# Patient Record
Sex: Male | Born: 1935 | Race: Black or African American | Hispanic: No | Marital: Married | Smoking: Former smoker
Health system: Southern US, Community
[De-identification: ages and names within clinical notes are randomized; demographics above are authoritative.]

## PROBLEM LIST (undated history)

## (undated) DIAGNOSIS — Z923 Personal history of irradiation: Secondary | ICD-10-CM

## (undated) DIAGNOSIS — G8929 Other chronic pain: Secondary | ICD-10-CM

## (undated) DIAGNOSIS — N400 Enlarged prostate without lower urinary tract symptoms: Secondary | ICD-10-CM

## (undated) DIAGNOSIS — R001 Bradycardia, unspecified: Secondary | ICD-10-CM

## (undated) DIAGNOSIS — M25519 Pain in unspecified shoulder: Secondary | ICD-10-CM

## (undated) DIAGNOSIS — K5909 Other constipation: Secondary | ICD-10-CM

## (undated) DIAGNOSIS — J449 Chronic obstructive pulmonary disease, unspecified: Secondary | ICD-10-CM

## (undated) DIAGNOSIS — R296 Repeated falls: Secondary | ICD-10-CM

## (undated) DIAGNOSIS — M4800 Spinal stenosis, site unspecified: Secondary | ICD-10-CM

## (undated) DIAGNOSIS — I10 Essential (primary) hypertension: Secondary | ICD-10-CM

## (undated) DIAGNOSIS — I4892 Unspecified atrial flutter: Secondary | ICD-10-CM

## (undated) DIAGNOSIS — IMO0002 Reserved for concepts with insufficient information to code with codable children: Secondary | ICD-10-CM

## (undated) DIAGNOSIS — K922 Gastrointestinal hemorrhage, unspecified: Secondary | ICD-10-CM

## (undated) DIAGNOSIS — S42302A Unspecified fracture of shaft of humerus, left arm, initial encounter for closed fracture: Secondary | ICD-10-CM

## (undated) DIAGNOSIS — I998 Other disorder of circulatory system: Secondary | ICD-10-CM

## (undated) DIAGNOSIS — E119 Type 2 diabetes mellitus without complications: Secondary | ICD-10-CM

## (undated) DIAGNOSIS — I5022 Chronic systolic (congestive) heart failure: Secondary | ICD-10-CM

## (undated) DIAGNOSIS — I4891 Unspecified atrial fibrillation: Secondary | ICD-10-CM

## (undated) DIAGNOSIS — M1990 Unspecified osteoarthritis, unspecified site: Secondary | ICD-10-CM

## (undated) DIAGNOSIS — E785 Hyperlipidemia, unspecified: Secondary | ICD-10-CM

## (undated) DIAGNOSIS — W1800XA Striking against unspecified object with subsequent fall, initial encounter: Secondary | ICD-10-CM

## (undated) DIAGNOSIS — I495 Sick sinus syndrome: Secondary | ICD-10-CM

## (undated) DIAGNOSIS — S2241XA Multiple fractures of ribs, right side, initial encounter for closed fracture: Secondary | ICD-10-CM

## (undated) DIAGNOSIS — C3490 Malignant neoplasm of unspecified part of unspecified bronchus or lung: Secondary | ICD-10-CM

## (undated) DIAGNOSIS — I70229 Atherosclerosis of native arteries of extremities with rest pain, unspecified extremity: Secondary | ICD-10-CM

## (undated) DIAGNOSIS — G629 Polyneuropathy, unspecified: Secondary | ICD-10-CM

## (undated) DIAGNOSIS — H9190 Unspecified hearing loss, unspecified ear: Secondary | ICD-10-CM

## (undated) DIAGNOSIS — I639 Cerebral infarction, unspecified: Secondary | ICD-10-CM

## (undated) DIAGNOSIS — F4310 Post-traumatic stress disorder, unspecified: Secondary | ICD-10-CM

## (undated) HISTORY — PX: CARDIOVERSION: SHX1299

## (undated) HISTORY — DX: Unspecified atrial fibrillation: I48.91

## (undated) HISTORY — DX: Other disorder of circulatory system: I99.8

## (undated) HISTORY — DX: Cerebral infarction, unspecified: I63.9

## (undated) HISTORY — DX: Hyperlipidemia, unspecified: E78.5

## (undated) HISTORY — DX: Polyneuropathy, unspecified: G62.9

## (undated) HISTORY — PX: KNEE SURGERY: SHX244

## (undated) HISTORY — DX: Unspecified atrial flutter: I48.92

## (undated) HISTORY — PX: OTHER SURGICAL HISTORY: SHX169

## (undated) HISTORY — DX: Atherosclerosis of native arteries of extremities with rest pain, unspecified extremity: I70.229

## (undated) HISTORY — DX: Pain in unspecified shoulder: M25.519

## (undated) HISTORY — DX: Chronic obstructive pulmonary disease, unspecified: J44.9

## (undated) HISTORY — DX: Gastrointestinal hemorrhage, unspecified: K92.2

## (undated) HISTORY — DX: Unspecified osteoarthritis, unspecified site: M19.90

## (undated) HISTORY — DX: Post-traumatic stress disorder, unspecified: F43.10

## (undated) HISTORY — DX: Other chronic pain: G89.29

## (undated) HISTORY — DX: Unspecified fracture of shaft of humerus, left arm, initial encounter for closed fracture: S42.302A

## (undated) HISTORY — DX: Unspecified hearing loss, unspecified ear: H91.90

## (undated) HISTORY — DX: Benign prostatic hyperplasia without lower urinary tract symptoms: N40.0

## (undated) HISTORY — DX: Spinal stenosis, site unspecified: M48.00

## (undated) HISTORY — DX: Essential (primary) hypertension: I10

## (undated) HISTORY — DX: Sick sinus syndrome: I49.5

## (undated) HISTORY — DX: Other constipation: K59.09

## (undated) HISTORY — DX: Bradycardia, unspecified: R00.1

## (undated) HISTORY — DX: Type 2 diabetes mellitus without complications: E11.9

## (undated) HISTORY — DX: Reserved for concepts with insufficient information to code with codable children: IMO0002

## (undated) HISTORY — DX: Personal history of irradiation: Z92.3

## (undated) HISTORY — DX: Repeated falls: R29.6

---

## 1995-02-28 DIAGNOSIS — I639 Cerebral infarction, unspecified: Secondary | ICD-10-CM

## 1995-02-28 HISTORY — DX: Cerebral infarction, unspecified: I63.9

## 2000-08-10 ENCOUNTER — Encounter: Admission: RE | Admit: 2000-08-10 | Discharge: 2000-09-21 | Payer: Self-pay | Admitting: Internal Medicine

## 2001-09-24 ENCOUNTER — Inpatient Hospital Stay (HOSPITAL_COMMUNITY): Admission: EM | Admit: 2001-09-24 | Discharge: 2001-10-02 | Payer: Self-pay | Admitting: Emergency Medicine

## 2001-09-24 ENCOUNTER — Encounter: Payer: Self-pay | Admitting: Emergency Medicine

## 2001-09-25 ENCOUNTER — Encounter: Payer: Self-pay | Admitting: Internal Medicine

## 2001-09-27 ENCOUNTER — Encounter (INDEPENDENT_AMBULATORY_CARE_PROVIDER_SITE_OTHER): Payer: Self-pay | Admitting: Cardiology

## 2001-10-02 ENCOUNTER — Encounter: Payer: Self-pay | Admitting: Internal Medicine

## 2001-11-12 ENCOUNTER — Ambulatory Visit (HOSPITAL_COMMUNITY): Admission: RE | Admit: 2001-11-12 | Discharge: 2001-11-12 | Payer: Self-pay | Admitting: Internal Medicine

## 2002-05-20 ENCOUNTER — Encounter: Payer: Self-pay | Admitting: Internal Medicine

## 2002-05-20 ENCOUNTER — Encounter: Admission: RE | Admit: 2002-05-20 | Discharge: 2002-05-20 | Payer: Self-pay | Admitting: Internal Medicine

## 2002-06-30 ENCOUNTER — Encounter: Admission: RE | Admit: 2002-06-30 | Discharge: 2002-09-12 | Payer: Self-pay | Admitting: Orthopaedic Surgery

## 2003-06-29 ENCOUNTER — Inpatient Hospital Stay (HOSPITAL_COMMUNITY): Admission: RE | Admit: 2003-06-29 | Discharge: 2003-07-09 | Payer: Self-pay | Admitting: Internal Medicine

## 2003-06-30 ENCOUNTER — Encounter (INDEPENDENT_AMBULATORY_CARE_PROVIDER_SITE_OTHER): Payer: Self-pay | Admitting: Cardiology

## 2003-08-07 ENCOUNTER — Inpatient Hospital Stay (HOSPITAL_COMMUNITY): Admission: EM | Admit: 2003-08-07 | Discharge: 2003-08-14 | Payer: Self-pay | Admitting: Emergency Medicine

## 2003-08-09 ENCOUNTER — Encounter (INDEPENDENT_AMBULATORY_CARE_PROVIDER_SITE_OTHER): Payer: Self-pay | Admitting: Specialist

## 2003-08-15 ENCOUNTER — Emergency Department (HOSPITAL_COMMUNITY): Admission: EM | Admit: 2003-08-15 | Discharge: 2003-08-15 | Payer: Self-pay | Admitting: Emergency Medicine

## 2003-08-21 ENCOUNTER — Inpatient Hospital Stay (HOSPITAL_COMMUNITY): Admission: EM | Admit: 2003-08-21 | Discharge: 2003-08-27 | Payer: Self-pay | Admitting: Emergency Medicine

## 2003-08-21 ENCOUNTER — Emergency Department (HOSPITAL_COMMUNITY): Admission: EM | Admit: 2003-08-21 | Discharge: 2003-08-21 | Payer: Self-pay | Admitting: Emergency Medicine

## 2003-09-02 ENCOUNTER — Emergency Department (HOSPITAL_COMMUNITY): Admission: EM | Admit: 2003-09-02 | Discharge: 2003-09-02 | Payer: Self-pay | Admitting: Emergency Medicine

## 2003-09-16 ENCOUNTER — Ambulatory Visit (HOSPITAL_COMMUNITY): Admission: RE | Admit: 2003-09-16 | Discharge: 2003-09-16 | Payer: Self-pay | Admitting: Internal Medicine

## 2005-05-03 ENCOUNTER — Encounter: Admission: RE | Admit: 2005-05-03 | Discharge: 2005-05-03 | Payer: Self-pay | Admitting: Cardiology

## 2005-10-07 ENCOUNTER — Emergency Department (HOSPITAL_COMMUNITY): Admission: EM | Admit: 2005-10-07 | Discharge: 2005-10-07 | Payer: Self-pay | Admitting: *Deleted

## 2006-10-25 ENCOUNTER — Encounter: Admission: RE | Admit: 2006-10-25 | Discharge: 2006-10-25 | Payer: Self-pay | Admitting: Cardiology

## 2009-06-16 ENCOUNTER — Encounter: Admission: RE | Admit: 2009-06-16 | Discharge: 2009-06-16 | Payer: Self-pay | Admitting: Podiatry

## 2009-10-08 ENCOUNTER — Encounter: Admission: RE | Admit: 2009-10-08 | Discharge: 2009-10-08 | Payer: Self-pay | Admitting: Internal Medicine

## 2009-10-11 ENCOUNTER — Inpatient Hospital Stay (HOSPITAL_COMMUNITY): Admission: EM | Admit: 2009-10-11 | Discharge: 2009-10-21 | Payer: Self-pay | Admitting: Emergency Medicine

## 2009-10-12 ENCOUNTER — Encounter (INDEPENDENT_AMBULATORY_CARE_PROVIDER_SITE_OTHER): Payer: Self-pay | Admitting: Internal Medicine

## 2009-10-19 ENCOUNTER — Ambulatory Visit: Payer: Self-pay | Admitting: Physical Medicine & Rehabilitation

## 2009-10-21 ENCOUNTER — Inpatient Hospital Stay (HOSPITAL_COMMUNITY)
Admission: RE | Admit: 2009-10-21 | Discharge: 2009-11-16 | Payer: Self-pay | Admitting: Physical Medicine & Rehabilitation

## 2009-11-08 ENCOUNTER — Ambulatory Visit: Payer: Self-pay | Admitting: Physical Medicine & Rehabilitation

## 2010-03-20 ENCOUNTER — Encounter: Payer: Self-pay | Admitting: Internal Medicine

## 2010-05-12 LAB — GLUCOSE, CAPILLARY
Glucose-Capillary: 105 mg/dL — ABNORMAL HIGH (ref 70–99)
Glucose-Capillary: 107 mg/dL — ABNORMAL HIGH (ref 70–99)
Glucose-Capillary: 111 mg/dL — ABNORMAL HIGH (ref 70–99)
Glucose-Capillary: 119 mg/dL — ABNORMAL HIGH (ref 70–99)
Glucose-Capillary: 119 mg/dL — ABNORMAL HIGH (ref 70–99)
Glucose-Capillary: 149 mg/dL — ABNORMAL HIGH (ref 70–99)
Glucose-Capillary: 87 mg/dL (ref 70–99)
Glucose-Capillary: 87 mg/dL (ref 70–99)
Glucose-Capillary: 91 mg/dL (ref 70–99)
Glucose-Capillary: 101 mg/dL — ABNORMAL HIGH (ref 70–99)
Glucose-Capillary: 103 mg/dL — ABNORMAL HIGH (ref 70–99)
Glucose-Capillary: 104 mg/dL — ABNORMAL HIGH (ref 70–99)
Glucose-Capillary: 106 mg/dL — ABNORMAL HIGH (ref 70–99)
Glucose-Capillary: 107 mg/dL — ABNORMAL HIGH (ref 70–99)
Glucose-Capillary: 110 mg/dL — ABNORMAL HIGH (ref 70–99)
Glucose-Capillary: 110 mg/dL — ABNORMAL HIGH (ref 70–99)
Glucose-Capillary: 111 mg/dL — ABNORMAL HIGH (ref 70–99)
Glucose-Capillary: 111 mg/dL — ABNORMAL HIGH (ref 70–99)
Glucose-Capillary: 114 mg/dL — ABNORMAL HIGH (ref 70–99)
Glucose-Capillary: 117 mg/dL — ABNORMAL HIGH (ref 70–99)
Glucose-Capillary: 122 mg/dL — ABNORMAL HIGH (ref 70–99)
Glucose-Capillary: 124 mg/dL — ABNORMAL HIGH (ref 70–99)
Glucose-Capillary: 125 mg/dL — ABNORMAL HIGH (ref 70–99)
Glucose-Capillary: 126 mg/dL — ABNORMAL HIGH (ref 70–99)
Glucose-Capillary: 129 mg/dL — ABNORMAL HIGH (ref 70–99)
Glucose-Capillary: 130 mg/dL — ABNORMAL HIGH (ref 70–99)
Glucose-Capillary: 133 mg/dL — ABNORMAL HIGH (ref 70–99)
Glucose-Capillary: 134 mg/dL — ABNORMAL HIGH (ref 70–99)
Glucose-Capillary: 138 mg/dL — ABNORMAL HIGH (ref 70–99)
Glucose-Capillary: 138 mg/dL — ABNORMAL HIGH (ref 70–99)
Glucose-Capillary: 142 mg/dL — ABNORMAL HIGH (ref 70–99)
Glucose-Capillary: 86 mg/dL (ref 70–99)
Glucose-Capillary: 86 mg/dL (ref 70–99)
Glucose-Capillary: 90 mg/dL (ref 70–99)
Glucose-Capillary: 92 mg/dL (ref 70–99)
Glucose-Capillary: 93 mg/dL (ref 70–99)
Glucose-Capillary: 93 mg/dL (ref 70–99)
Glucose-Capillary: 94 mg/dL (ref 70–99)
Glucose-Capillary: 100 mg/dL — ABNORMAL HIGH (ref 70–99)
Glucose-Capillary: 100 mg/dL — ABNORMAL HIGH (ref 70–99)
Glucose-Capillary: 102 mg/dL — ABNORMAL HIGH (ref 70–99)
Glucose-Capillary: 105 mg/dL — ABNORMAL HIGH (ref 70–99)
Glucose-Capillary: 113 mg/dL — ABNORMAL HIGH (ref 70–99)
Glucose-Capillary: 119 mg/dL — ABNORMAL HIGH (ref 70–99)
Glucose-Capillary: 120 mg/dL — ABNORMAL HIGH (ref 70–99)
Glucose-Capillary: 122 mg/dL — ABNORMAL HIGH (ref 70–99)
Glucose-Capillary: 122 mg/dL — ABNORMAL HIGH (ref 70–99)
Glucose-Capillary: 123 mg/dL — ABNORMAL HIGH (ref 70–99)
Glucose-Capillary: 125 mg/dL — ABNORMAL HIGH (ref 70–99)
Glucose-Capillary: 126 mg/dL — ABNORMAL HIGH (ref 70–99)
Glucose-Capillary: 128 mg/dL — ABNORMAL HIGH (ref 70–99)
Glucose-Capillary: 141 mg/dL — ABNORMAL HIGH (ref 70–99)
Glucose-Capillary: 141 mg/dL — ABNORMAL HIGH (ref 70–99)
Glucose-Capillary: 148 mg/dL — ABNORMAL HIGH (ref 70–99)
Glucose-Capillary: 154 mg/dL — ABNORMAL HIGH (ref 70–99)
Glucose-Capillary: 155 mg/dL — ABNORMAL HIGH (ref 70–99)
Glucose-Capillary: 158 mg/dL — ABNORMAL HIGH (ref 70–99)
Glucose-Capillary: 159 mg/dL — ABNORMAL HIGH (ref 70–99)
Glucose-Capillary: 167 mg/dL — ABNORMAL HIGH (ref 70–99)
Glucose-Capillary: 168 mg/dL — ABNORMAL HIGH (ref 70–99)
Glucose-Capillary: 182 mg/dL — ABNORMAL HIGH (ref 70–99)
Glucose-Capillary: 182 mg/dL — ABNORMAL HIGH (ref 70–99)
Glucose-Capillary: 186 mg/dL — ABNORMAL HIGH (ref 70–99)
Glucose-Capillary: 67 mg/dL — ABNORMAL LOW (ref 70–99)
Glucose-Capillary: 82 mg/dL (ref 70–99)
Glucose-Capillary: 88 mg/dL (ref 70–99)
Glucose-Capillary: 92 mg/dL (ref 70–99)
Glucose-Capillary: 93 mg/dL (ref 70–99)
Glucose-Capillary: 95 mg/dL (ref 70–99)
Glucose-Capillary: 96 mg/dL (ref 70–99)
Glucose-Capillary: 96 mg/dL (ref 70–99)
Glucose-Capillary: 99 mg/dL (ref 70–99)

## 2010-05-12 LAB — CBC
HCT: 40.2 % (ref 39.0–52.0)
HCT: 41 % (ref 39.0–52.0)
HCT: 41.3 % (ref 39.0–52.0)
HCT: 42.9 % (ref 39.0–52.0)
HCT: 42.9 % (ref 39.0–52.0)
HCT: 43 % (ref 39.0–52.0)
HCT: 43 % (ref 39.0–52.0)
HCT: 43.8 % (ref 39.0–52.0)
HCT: 44.8 % (ref 39.0–52.0)
Hemoglobin: 13.6 g/dL (ref 13.0–17.0)
Hemoglobin: 13.7 g/dL (ref 13.0–17.0)
Hemoglobin: 14 g/dL (ref 13.0–17.0)
Hemoglobin: 14.4 g/dL (ref 13.0–17.0)
Hemoglobin: 14.5 g/dL (ref 13.0–17.0)
Hemoglobin: 15 g/dL (ref 13.0–17.0)
Hemoglobin: 15.1 g/dL (ref 13.0–17.0)
Hemoglobin: 15.6 g/dL (ref 13.0–17.0)
MCH: 32.3 pg (ref 26.0–34.0)
MCH: 31.8 pg (ref 26.0–34.0)
MCH: 31.9 pg (ref 26.0–34.0)
MCH: 32.2 pg (ref 26.0–34.0)
MCH: 32.3 pg (ref 26.0–34.0)
MCH: 32.8 pg (ref 26.0–34.0)
MCH: 33.1 pg (ref 26.0–34.0)
MCH: 33.2 pg (ref 26.0–34.0)
MCHC: 32.6 g/dL (ref 30.0–36.0)
MCHC: 32.8 g/dL (ref 30.0–36.0)
MCHC: 32.8 g/dL (ref 30.0–36.0)
MCHC: 32.9 g/dL (ref 30.0–36.0)
MCHC: 33.1 g/dL (ref 30.0–36.0)
MCHC: 33.1 g/dL (ref 30.0–36.0)
MCHC: 34.6 g/dL (ref 30.0–36.0)
MCV: 94.4 fL (ref 78.0–100.0)
MCV: 95.3 fL (ref 78.0–100.0)
MCV: 95.3 fL (ref 78.0–100.0)
MCV: 96 fL (ref 78.0–100.0)
MCV: 96 fL (ref 78.0–100.0)
MCV: 96.9 fL (ref 78.0–100.0)
MCV: 97.1 fL (ref 78.0–100.0)
MCV: 97.7 fL (ref 78.0–100.0)
MCV: 98.6 fL (ref 78.0–100.0)
Platelets: 227 10*3/uL (ref 150–400)
Platelets: 101 10*3/uL — ABNORMAL LOW (ref 150–400)
Platelets: 131 10*3/uL — ABNORMAL LOW (ref 150–400)
Platelets: 143 10*3/uL — ABNORMAL LOW (ref 150–400)
Platelets: 68 10*3/uL — ABNORMAL LOW (ref 150–400)
Platelets: 71 10*3/uL — ABNORMAL LOW (ref 150–400)
Platelets: 80 10*3/uL — ABNORMAL LOW (ref 150–400)
Platelets: 84 10*3/uL — ABNORMAL LOW (ref 150–400)
Platelets: 94 10*3/uL — ABNORMAL LOW (ref 150–400)
RBC: 4.26 MIL/uL (ref 4.22–5.81)
RBC: 4.23 MIL/uL (ref 4.22–5.81)
RBC: 4.29 MIL/uL (ref 4.22–5.81)
RBC: 4.39 MIL/uL (ref 4.22–5.81)
RBC: 4.56 MIL/uL (ref 4.22–5.81)
RBC: 4.7 MIL/uL (ref 4.22–5.81)
RDW: 15.3 % (ref 11.5–15.5)
RDW: 15 % (ref 11.5–15.5)
RDW: 15 % (ref 11.5–15.5)
RDW: 15.7 % — ABNORMAL HIGH (ref 11.5–15.5)
RDW: 15.9 % — ABNORMAL HIGH (ref 11.5–15.5)
RDW: 16.4 % — ABNORMAL HIGH (ref 11.5–15.5)
WBC: 5.1 10*3/uL (ref 4.0–10.5)
WBC: 5.5 10*3/uL (ref 4.0–10.5)
WBC: 4.2 10*3/uL (ref 4.0–10.5)
WBC: 4.4 10*3/uL (ref 4.0–10.5)
WBC: 4.5 10*3/uL (ref 4.0–10.5)

## 2010-05-12 LAB — COMPREHENSIVE METABOLIC PANEL
ALT: 16 U/L (ref 0–53)
ALT: 17 U/L (ref 0–53)
ALT: 18 U/L (ref 0–53)
ALT: 20 U/L (ref 0–53)
ALT: 22 U/L (ref 0–53)
ALT: 24 U/L (ref 0–53)
AST: 24 U/L (ref 0–37)
AST: 30 U/L (ref 0–37)
AST: 30 U/L (ref 0–37)
AST: 32 U/L (ref 0–37)
AST: 33 U/L (ref 0–37)
Albumin: 2.5 g/dL — ABNORMAL LOW (ref 3.5–5.2)
Albumin: 2.6 g/dL — ABNORMAL LOW (ref 3.5–5.2)
Albumin: 2.6 g/dL — ABNORMAL LOW (ref 3.5–5.2)
Albumin: 2.9 g/dL — ABNORMAL LOW (ref 3.5–5.2)
Albumin: 3.2 g/dL — ABNORMAL LOW (ref 3.5–5.2)
Alkaline Phosphatase: 56 U/L (ref 39–117)
Alkaline Phosphatase: 56 U/L (ref 39–117)
Alkaline Phosphatase: 58 U/L (ref 39–117)
Alkaline Phosphatase: 64 U/L (ref 39–117)
BUN: 11 mg/dL (ref 6–23)
BUN: 15 mg/dL (ref 6–23)
BUN: 16 mg/dL (ref 6–23)
BUN: 18 mg/dL (ref 6–23)
CO2: 25 mEq/L (ref 19–32)
CO2: 36 mEq/L — ABNORMAL HIGH (ref 19–32)
CO2: 36 mEq/L — ABNORMAL HIGH (ref 19–32)
CO2: 38 mEq/L — ABNORMAL HIGH (ref 19–32)
CO2: 38 mEq/L — ABNORMAL HIGH (ref 19–32)
CO2: 40 mEq/L — ABNORMAL HIGH (ref 19–32)
Calcium: 8.6 mg/dL (ref 8.4–10.5)
Calcium: 8.6 mg/dL (ref 8.4–10.5)
Calcium: 8.8 mg/dL (ref 8.4–10.5)
Calcium: 8.8 mg/dL (ref 8.4–10.5)
Calcium: 8.8 mg/dL (ref 8.4–10.5)
Calcium: 8.8 mg/dL (ref 8.4–10.5)
Calcium: 8.9 mg/dL (ref 8.4–10.5)
Chloride: 107 mEq/L (ref 96–112)
Chloride: 96 mEq/L (ref 96–112)
Chloride: 97 mEq/L (ref 96–112)
Chloride: 99 mEq/L (ref 96–112)
Creatinine, Ser: 0.81 mg/dL (ref 0.4–1.5)
Creatinine, Ser: 0.91 mg/dL (ref 0.4–1.5)
Creatinine, Ser: 0.95 mg/dL (ref 0.4–1.5)
Creatinine, Ser: 1.04 mg/dL (ref 0.4–1.5)
Creatinine, Ser: 1.07 mg/dL (ref 0.4–1.5)
Creatinine, Ser: 1.07 mg/dL (ref 0.4–1.5)
GFR calc Af Amer: >60 mL/min (ref >60)
GFR calc Af Amer: >60 mL/min (ref >60)
GFR calc Af Amer: >60 mL/min (ref >60)
GFR calc Af Amer: >60 mL/min (ref >60)
GFR calc Af Amer: >60 mL/min (ref >60)
GFR calc non Af Amer: >60 mL/min (ref >60)
GFR calc non Af Amer: >60 mL/min (ref >60)
GFR calc non Af Amer: >60 mL/min (ref >60)
GFR calc non Af Amer: >60 mL/min (ref >60)
GFR calc non Af Amer: >60 mL/min (ref >60)
Glucose, Bld: 108 mg/dL — ABNORMAL HIGH (ref 70–99)
Glucose, Bld: 86 mg/dL (ref 70–99)
Glucose, Bld: 89 mg/dL (ref 70–99)
Glucose, Bld: 96 mg/dL (ref 70–99)
Glucose, Bld: 97 mg/dL (ref 70–99)
Potassium: 3.5 mEq/L (ref 3.5–5.1)
Potassium: 3.6 mEq/L (ref 3.5–5.1)
Potassium: 3.7 mEq/L (ref 3.5–5.1)
Sodium: 140 mEq/L (ref 135–145)
Sodium: 141 mEq/L (ref 135–145)
Sodium: 143 mEq/L (ref 135–145)
Sodium: 143 mEq/L (ref 135–145)
Sodium: 144 mEq/L (ref 135–145)
Sodium: 145 mEq/L (ref 135–145)
Sodium: 145 mEq/L (ref 135–145)
Total Bilirubin: 0.5 mg/dL (ref 0.3–1.2)
Total Bilirubin: 0.7 mg/dL (ref 0.3–1.2)
Total Bilirubin: 0.8 mg/dL (ref 0.3–1.2)
Total Bilirubin: 0.9 mg/dL (ref 0.3–1.2)
Total Protein: 5.2 g/dL — ABNORMAL LOW (ref 6.0–8.3)
Total Protein: 5.7 g/dL — ABNORMAL LOW (ref 6.0–8.3)
Total Protein: 5.7 g/dL — ABNORMAL LOW (ref 6.0–8.3)
Total Protein: 5.8 g/dL — ABNORMAL LOW (ref 6.0–8.3)

## 2010-05-12 LAB — UIFE/LIGHT CHAINS/TP QN, 24-HR UR
Beta, Urine: DETECTED — AB
Free Lambda Excretion/Day: 6.48 mg/d
Free Lambda Lt Chains,Ur: 0.1 mg/dL (ref 0.08–1.01)
Free Lt Chn Excr Rate: 49.86 mg/d
Time: 24 hours
Total Protein, Urine-Ur/day: 117 mg/d (ref 10–140)
Total Protein, Urine: 1.8 mg/dL
Volume, Urine: 6475 mL

## 2010-05-12 LAB — DIGOXIN LEVEL
Digoxin Level: 0.5 ng/mL — ABNORMAL LOW (ref 0.8–2.0)
Digoxin Level: 0.6 ng/mL — ABNORMAL LOW (ref 0.8–2.0)
Digoxin Level: 1 ng/mL (ref 0.8–2.0)

## 2010-05-12 LAB — BASIC METABOLIC PANEL
BUN: 11 mg/dL (ref 6–23)
BUN: 11 mg/dL (ref 6–23)
BUN: 12 mg/dL (ref 6–23)
CO2: 30 mEq/L (ref 19–32)
CO2: 33 mEq/L — ABNORMAL HIGH (ref 19–32)
CO2: 34 mEq/L — ABNORMAL HIGH (ref 19–32)
CO2: 35 mEq/L — ABNORMAL HIGH (ref 19–32)
Calcium: 8.8 mg/dL (ref 8.4–10.5)
Calcium: 8.9 mg/dL (ref 8.4–10.5)
Calcium: 8.9 mg/dL (ref 8.4–10.5)
Chloride: 105 mEq/L (ref 96–112)
Chloride: 101 mEq/L (ref 96–112)
Chloride: 103 mEq/L (ref 96–112)
Chloride: 101 mEq/L (ref 96–112)
Chloride: 99 mEq/L (ref 96–112)
Creatinine, Ser: 0.74 mg/dL (ref 0.4–1.5)
Creatinine, Ser: 0.81 mg/dL (ref 0.4–1.5)
Creatinine, Ser: 1.05 mg/dL (ref 0.4–1.5)
GFR calc Af Amer: >60 mL/min (ref >60)
GFR calc Af Amer: >60 mL/min (ref >60)
GFR calc Af Amer: >60 mL/min (ref >60)
GFR calc Af Amer: >60 mL/min (ref >60)
GFR calc non Af Amer: 51 mL/min — ABNORMAL LOW (ref >60)
Glucose, Bld: 106 mg/dL — ABNORMAL HIGH (ref 70–99)
Potassium: 4.2 mEq/L (ref 3.5–5.1)
Potassium: 4.3 mEq/L (ref 3.5–5.1)
Sodium: 141 mEq/L (ref 135–145)
Sodium: 141 mEq/L (ref 135–145)
Sodium: 140 mEq/L (ref 135–145)
Sodium: 144 mEq/L (ref 135–145)

## 2010-05-12 LAB — CARDIAC PANEL(CRET KIN+CKTOT+MB+TROPI)
CK, MB: 2.5 ng/mL (ref 0.3–4.0)
Relative Index: 1.8 (ref 0.0–2.5)
Relative Index: 2 (ref 0.0–2.5)
Total CK: 110 U/L (ref 7–232)
Total CK: 139 U/L (ref 7–232)
Troponin I: 0.05 ng/mL (ref 0.00–0.06)

## 2010-05-12 LAB — DIFFERENTIAL
Basophils Absolute: 0 10*3/uL (ref 0.0–0.1)
Basophils Absolute: 0 10*3/uL (ref 0.0–0.1)
Basophils Relative: 1 % (ref 0–1)
Basophils Relative: 1 % (ref 0–1)
Eosinophils Absolute: 0 10*3/uL (ref 0.0–0.7)
Eosinophils Absolute: 0 10*3/uL (ref 0.0–0.7)
Eosinophils Absolute: 0.1 10*3/uL (ref 0.0–0.7)
Eosinophils Relative: 1 % (ref 0–5)
Eosinophils Relative: 1 % (ref 0–5)
Lymphocytes Relative: 22 % (ref 12–46)
Lymphocytes Relative: 29 % (ref 12–46)
Lymphocytes Relative: 55 % — ABNORMAL HIGH (ref 12–46)
Lymphs Abs: 1.1 10*3/uL (ref 0.7–4.0)
Lymphs Abs: 2.4 10*3/uL (ref 0.7–4.0)
Monocytes Relative: 10 % (ref 3–12)
Monocytes Relative: 5 % (ref 3–12)
Monocytes Relative: 8 % (ref 3–12)
Neutro Abs: 2.9 10*3/uL (ref 1.7–7.7)
Neutro Abs: 3.1 10*3/uL (ref 1.7–7.7)
Neutrophils Relative %: 65 % (ref 43–77)
Neutrophils Relative %: 67 % (ref 43–77)
Neutrophils Relative %: 72 % (ref 43–77)

## 2010-05-12 LAB — PROTEIN ELECTROPHORESIS, SERUM
Albumin ELP: 57.3 % (ref 55.8–66.1)
Alpha-1-Globulin: 4.8 % (ref 2.9–4.9)
Alpha-2-Globulin: 9.4 % (ref 7.1–11.8)
Beta 2: 4.4 % (ref 3.2–6.5)
Beta Globulin: 5.4 % (ref 4.7–7.2)

## 2010-05-12 LAB — URINALYSIS, ROUTINE W REFLEX MICROSCOPIC
Bilirubin Urine: NEGATIVE
Glucose, UA: NEGATIVE mg/dL
Hgb urine dipstick: NEGATIVE
Specific Gravity, Urine: 1.015 (ref 1.005–1.030)
Urobilinogen, UA: 4 mg/dL — ABNORMAL HIGH (ref 0.0–1.0)
pH: 8 (ref 5.0–8.0)

## 2010-05-12 LAB — URINE CULTURE: Culture  Setup Time: 201108220950

## 2010-05-12 LAB — CULTURE, BLOOD (ROUTINE X 2): Culture: NO GROWTH

## 2010-05-12 LAB — BRAIN NATRIURETIC PEPTIDE
Pro B Natriuretic peptide (BNP): 165 pg/mL — ABNORMAL HIGH (ref 0.0–100.0)
Pro B Natriuretic peptide (BNP): 244 pg/mL — ABNORMAL HIGH (ref 0.0–100.0)
Pro B Natriuretic peptide (BNP): 590 pg/mL — ABNORMAL HIGH (ref 0.0–100.0)
Pro B Natriuretic peptide (BNP): 595 pg/mL — ABNORMAL HIGH (ref 0.0–100.0)
Pro B Natriuretic peptide (BNP): 611 pg/mL — ABNORMAL HIGH (ref 0.0–100.0)

## 2010-05-12 LAB — CREATININE CLEARANCE, URINE, 24 HOUR
Creatinine, 24H Ur: 1178 mg/d (ref 800–2000)
Creatinine: 1.35 mg/dL (ref 0.4–1.5)
Urine Total Volume-CRCL: 6475 mL

## 2010-05-12 LAB — MRSA PCR SCREENING: MRSA by PCR: NEGATIVE

## 2010-05-12 LAB — HEMOGLOBIN A1C
Hgb A1c MFr Bld: 5.9 % — ABNORMAL HIGH (ref <5.7)
Mean Plasma Glucose: 123 mg/dL — ABNORMAL HIGH (ref <117)

## 2010-07-15 NOTE — Op Note (Signed)
NAME:  [name], [name]                         ACCOUNT NO.:  [id number]   MEDICAL RECORD NO.:  [id number]                   PATIENT TYPE:  INP   LOCATION:  3306                                 FACILITY:  [facility]   PHYSICIAN:  Georgiana Spinner, M.D.                 DATE OF BIRTH:  [birth year]   DATE OF PROCEDURE:  08/09/2003  DATE OF DISCHARGE:                                 OPERATIVE REPORT   PROCEDURE:  Upper endoscopy.   INDICATIONS:  Recent gastrointestinal bleeding, presumed upper because of  black, tarry stools.   ANESTHESIA:  1. Demerol 20 mg.  2. Versed 3 mg.   DESCRIPTION OF PROCEDURE:  With patient mildly sedated in the left lateral  decubitus position, the Olympus videoscopic endoscope was inserted in the  mouth, passed under direct vision through the esophagus, which appeared  normal.  In the distal esophagus, there was a question of a varix, but there  was no red spot or indications that it may have bled.  There was only a  single varix without a column.  We entered into the stomach, and some  scattered erythema consistent with a gastritis etiology was seen in the  fundus.  This was photographed.  It was rather scattered and superficial-  appearing with no extension into the body or antrum, and this was  photographed and biopsied.  The body and antrum appeared normal.  There was  bile seen in the stomach.  We entered into the duodenal bulb, second portion  of duodenum.  Again, bile was seen.  The endoscope was then withdrawn taking  circumferential views of the duodenal mucosa until the endoscope then pulled  back into the stomach, placed in retroflexion to view the stomach from  below.  The endoscope was then straightened and withdrawn, taking  circumferential views of the remaining gastric and esophageal mucosa.  The  patient's vital signs and pulse oximeter remained stable.  The patient  tolerated the procedure well without apparent complications.   FINDINGS:  1. Mild  erythema of the gastric fundus, biopsied.  Question of a single     varix in the distal esophagus of significance unknown.  2. Otherwise, an unremarkable examination with bile seen in the stomach.   IMPRESSION:  No evidence of acute gastrointestinal bleeding etiology found.   PLAN:  Await biopsies and proceed with small-bowel follow-through.  Of note,  probably will need further studies such as capsule endoscopy and/or push  enteroscopy.                                               Georgiana Spinner, M.D.    GMO/MEDQ  D:  08/09/2003  T:  08/09/2003  Job:  [id number]   cc:   Loraine Leriche A. Perini, M.D.  [address]  [place]  [place] [address]  Fax: [phone]

## 2010-07-15 NOTE — Op Note (Signed)
NAME:  [name], [name]                         ACCOUNT NO.:  [id number]   MEDICAL RECORD NO.:  [id number]                   PATIENT TYPE:  INP   LOCATION:  4708                                 FACILITY:  [facility]   PHYSICIAN:  W. Ashley Royalty., M.D.         DATE OF BIRTH:  07-14-1928   DATE OF PROCEDURE:  07/03/2003  DATE OF DISCHARGE:                                 OPERATIVE REPORT   PROCEDURE:  Cardioversion.   INDICATIONS FOR PROCEDURE:  Atrial fibrillation.   The patient underwent anesthesia by Dr. Judie Petit at the bedside with  250 mg Pentothal IV.  He was n.p.o. after midnight.  Cardioversion was done  with biphasic synchronized defibrillation with anterior and posterior pads  at 120 watts with reversion to sinus rhythm with PACs and occasional PVCs.   IMPRESSION:  Successful cardioversion of atrial fibrillation.                                               Darden Palmer., M.D.    WST/MEDQ  D:  07/03/2003  T:  07/03/2003  Job:  [id number]

## 2010-07-15 NOTE — Discharge Summary (Signed)
NAME:  [name], [name]                         ACCOUNT NO.:  [id number]   MEDICAL RECORD NO.:  [id number]                   PATIENT TYPE:  INP   LOCATION:  3702                                 FACILITY:  [facility]   PHYSICIAN:  Mark A. Perini, M.D.                DATE OF BIRTH:  October 19, 1928   DATE OF ADMISSION:  09/24/2001  DATE OF DISCHARGE:  10/02/2001                                 DISCHARGE SUMMARY   DISCHARGE DIAGNOSES:  1. Fever, leukocytosis, dysuria and diarrhea - infectious process of unclear     source, possibly due to urinary tract infection  2. Atrial fibrillation with rapid ventricular response secondary to #1,     converted to normal sinus rhythm on amiodarone this admission; will     require long-term anticoagulation  3. Hypertension  4. Type 2 diabetes  5. Hyperlipidemia  6. Mild rhabdomyolysis this admission, resolved with hydration; Zocor is to     be on hold until further decision can be made concerning this  7. History of spinal stenosis  8. Benign prostatic hypertrophy  9. Cerebrovascular disease status post cerebrovascular accident in 1997 with     left hemiparesis  10.      Volume overload  11.      Hypoxia   PROCEDURE:  2-D echocardiogram on 09/27/2001 which showed upper limits of  normal left ventricular size, overall LV function was normal with ejection  fraction estimated at 55-65%, possible septal hypokinesis, moderate to  markedly increased left ventricular wall thickness, mild increased thickness  of the aortic valve and mild left atrial dilatation.   DISCHARGE MEDICATIONS:  1. Cardura 4 mg once daily as before  2. Baby aspirin 81 mg once daily  3. Protonix 40 mg once daily with food  4. Flagyl 500 mg t.i.d. for 5 further days  5. Tequin 400 mg once daily for 5 more days  6. Glucotrol XL 2.5 mg daily; check sugars 2-3 times a day and call if he is     staying greater than 250 or having lows less than 65  7. Lasix 20 mg once daily  8. Amiodarone  200 mg once daily  9. Norvasc 10 mg once daily  10.      Altace 10 mg once daily  11.      Detrol-LA 4 mg once daily  12.      Zocor is to be on hold  13.      Senokot as needed  14.      Tylenol as needed  15.      Coumadin 5 mg once each evening  16.      Lopressor 50 mg 1/2 tablet twice a day  17.      Oxygen 2 L nasal cannula which will be reassessed as an outpatient     and hopefully discontinued soon   HISTORY OF PRESENT ILLNESS:  Mr.  Knight is a pleasant 77 year old gentleman  who presented on 08/24/01 with 2-3 days of loose watery diarrhea as well as  significant urinary urgency and frequency and some incontinence with  elevated temperature to 102 and an elevated white count to 22,000.  He was  placed on oral hydration and Avelox and discharged home.  However, within 16-  24 hours he was noted to not be taking sufficient oral intake and was more  somnolent than was normal and was brought to the emergency room.  There he  was found to be somewhat dehydrated with continued elevated white count and  fever.  He was, therefore, admitted.   HOSPITAL COURSE:  Urinalysis did show significant hematuria and pyuria.  However, cultures remain negative of the blood.  He was placed on Tequin and  Flagyl.  He did develop atrial fibrillation with rapid ventricular response  and was moved to a telemetry bed.  He was treated with a Cardizem drip as  well as oral Lopressor.  Cardiology was consulted and echocardiogram was  obtained with results as noted above.  He was subsequently placed on  amiodarone and did eventually convert to normal sinus rhythm.  He was  anticoagulated with Lovenox and placed on oral Coumadin until his INR was  therapeutic.  He was noted to have mild rhabdomyolysis with elevated CK  enzymes on admission.  This improved with hydration during the first 4-5  days of his admission.  His blood sugars remained stable.  [name] did  have some mild hypoxia with oxygen saturation  as low as 86%.  His oxygen  saturation remained in the mid to upper 90's on 2 L of oxygen.  He was  diuresed during the last 2-3 days of his hospital stay with Lasix.   By 10/02/01 [name] felt significantly improved.  He did still have a mild  low grade temperature and mildly elevated white count but was deemed stable  for discharge home for continued outpatient therapy and close outpatient  follow up.   DISCHARGE PHYSICAL EXAMINATION:  VITAL SIGNS:  Current temperature is 98.6,  maximum temperature at 24 hours 99.7, blood pressure is 140/80, respiratory  rate is 20 with 89% saturation on room air and 96% saturation on 2 L of  oxygen.  He had 450 cc out greater than in.  Blood sugars ranged from 57 to  153.  GENERAL:  He was sitting in the chair in no acute distress.  NECK:  There was no JVD.  LUNGS:  He had distant breath sounds throughout, perhaps a little decreased  at the left lower lobe.  ABDOMEN:  Soft and nontender.  EXTREMITIES:  There was no significant lower extremity edema.   NOTABLE LABORATORY DATA:  INR was 2.1 on 10/01/01; INR on 8/6 is pending.  White count was 16,600 with 73% segs, 16% lymphocytes, 4% monocytes;  hemoglobin 13.9 and platelet count 263,000.   Chest X-ray is pending at the time of discharge.   DISCHARGE INSTRUCTIONS:  [name] is to continue 5 further days of Tequin  and Flagyl.  He is to continue on Coumadin 5 mg daily.  Follow up in the lab  in 2 days for a PT, INR, blood count and __________.  He will be followed up  in one week in the office at which time these labs will be repeated again.  He is to take his medicines as instructed.  He is to call if he has any  problems including  recurrent fever, shortness of breath or other symptoms.  He is to follow a low salt, low fat diet.  He is to be up as tolerated with  his cane.  He is to have 24-hour supervision with his wife.  This has been discussed with him and his wife.                                               Mark A. Waynard Edwards, M.D.   MAP/MEDQ  D:  10/02/2001  T:  10/06/2001  Job:  [id number]   cc:   Darden Palmer., M.D.

## 2010-07-15 NOTE — H&P (Signed)
NAME:  JAXSIN, BOTTOMLEY                         ACCOUNT NO.:  [id number]   MEDICAL RECORD NO.:  [id number]                   PATIENT TYPE:  INP   LOCATION:  5151                                 FACILITY:  [facility]   PHYSICIAN:  Mark A. Perini, M.D.                DATE OF BIRTH:  Feb 01, 1929   DATE OF ADMISSION:  09/24/2001  DATE OF DISCHARGE:                                HISTORY & PHYSICAL   CHIEF COMPLAINT:  Fever, anorexia, somnolence, diarrhea, urinary frequency  and incontinence.   HISTORY OF PRESENT ILLNESS:  Mr. Echeverri is a pleasant 77 year old gentleman  with a history of cerebrovascular accident in 1997 with fairly significant  left hemiparesis although he still ambulates somewhat with this. He also has  a history of hypertension and diabetes and hyperlipidemia. He presented to  the clinic yesterday with complaint of 2-3 days of diarrhea with loose  watery brown stool. Furthermore, he had significant increased urinary  urgency and frequency with urge to go without always producing a lot of  urine. He had several episodes of urinary incontinence as well. He was found  to have a temperature of 102 orally and a white count of 22,000 with a left  shift. We were not able to obtain a urinalysis but he was placed empirically  on Avalox for a week and was discharged home for outpatient management and  advised to greatly increase oral hydration. This morning the patient,  however, was not taking sufficient P.O. intake and seemed somewhat somnolent  that is normal for him and therefore his wife called EMS and was brought to  the emergency room. In the emergency room, he is felt to be somewhat  dehydrated and will require inpatient admission for further care of this  syndrome.   PAST MEDICAL HISTORY:  1. Cerebrovascular accident in 1997 with left hemiparesis, left lower     extremity weakness greater than left upper extremity.  2. Hypertension  3. Type 2 diabetes since 1996  4.  Hyperlipidemia  5. Left shoulder shrapnel from a wound obtained on [place].  6. Spinal stenosis at L4 and L5  7. BPH symptoms   ALLERGIES:  No known allergies   MEDICATIONS IN THE EMERGENCY ROOM:  He has received normal saline at 200  cc/hour, thiamine 100 mg IM x1, Tequin 400 mg IV x1. His CBG was noted to be  low when EMS picked him up and he was given an amp of D50 prior to being  brought to the emergency room. He did take a dose of Avalox yesterday  afternoon and possibly a second dose this morning.   CHRONIC MEDICATIONS:  1. Norvasc 10 mg q.d.  2. Altace 10 mg q.d. which has been on hold for the last 24 hours  3. Glucotrol XL 5 mg QAM  4. Aspirin 81 mg q.d.  5. Detrol LA 4 mg q.d.  6. Cardura 4  mg q.d.  7. Zocor 20 mg q.h.s.  8. Senokot p.r.n.   SOCIAL HISTORY:  He has been married to his second wife since 24. He has  one son and three grandchildren. He has a 50 pack year smoking history, quit  smoking in 1996. He has no alcohol or drug use history.   FAMILY HISTORY:  Father died in his 64s of a myocardial infarction, mother  died in her 59s of diabetes and peripheral vascular disease.   REVIEW OF SYSTEMS:  As per the history of present illness and most notable  for weakness and fatigue, diarrhea for the last 2-3 days and poor P.O.  intake as well as the urinary symptoms.   PHYSICAL EXAMINATION:   VITAL SIGNS:  Currently 92% saturation on room air, temperature 101.4, CBG  is 159. Last blood pressure noted is 160/82, pulse 72 and respiratory rate  of 18.   GENERAL:  He is semisupine in bed in no acute distress. He is alert and  oriented x4. He has stable left hemiparesis with no new stroke deficits  noted.   LUNGS:  Clear to auscultation bilaterally.   HEART:  Regular with no murmur.   ABDOMEN:  Obese, soft and nontender.   EXTREMITIES:  Reveal no edema.   RECTAL:  Heme check 16 hours ago in the clinic was not positive for heme in  the stool.   LABORATORY  DATA:  CBC, blood cultures and cath urinalysis are all pending.  PA and lateral chest x-ray is reviewed and shows no acute disease. It is an  under penetrated film. Sodium is 148, potassium is 3.6, chloride 102, CO2  19, creatinine 1.5 with a BUN of 29 which compares to a baseline BUN of 10  and creatinine of 1.0 from May of this year. Glucose is 148, pH is 7.51 with  a pCO2 of 22 and a bicarb of 19. White count yesterday was 22,000 with  19,000 segs with a left shift of 87%. Platelet count was 101,000, hemoglobin  14.9 yesterday in the clinic.   ASSESSMENT AND PLAN:  A 77 year old male with infectious process consisting  of fever, leukocytosis, diarrhea and some urinary symptoms. We will admit.  We will hydrate with normal saline. We will follow-up lab work and blood  cultures and urine culture. However, these might be impaired by his  antibiotics over the last 24 hours prior to admission. Will get him out of  bed to chair twice a day and place him on DVT and ulcer prophylaxis. Will  cover with IV Tequin for the time being. Will treat for a urinary tract  infection but we may need to explore this further. Will have a check a C  difficile toxin if his diarrhea continues. He is a Full Code status.                                                  Mark A. Waynard Edwards, M.D.    MAP/MEDQ  D:  09/24/2001  T:  09/25/2001  Job:  [id number]

## 2010-07-15 NOTE — Discharge Summary (Signed)
NAME:  [name], [name]                         ACCOUNT NO.:  [id number]   MEDICAL RECORD NO.:  [id number]                   PATIENT TYPE:  INP   LOCATION:  5153                                 FACILITY:  [facility]   PHYSICIAN:  Mark A. Perini, M.D.                DATE OF BIRTH:  [birth year]   DATE OF ADMISSION:  08/21/2003  DATE OF DISCHARGE:  08/27/2003                                 DISCHARGE SUMMARY   DISCHARGE DIAGNOSES:  1. Febrile illness with acute decline in status felt to be due to a urinary     tract infection, the patient did have a low level Klebsiella urinary     tract infection diagnosed this admission and has been treated with Cipro     with improvement in overall status.  2. Recent hematuria after Foley catheter removal.  3. Reportedly recent abnormal prostate specific antigen blood test.  4. Dysphagia due to previous stroke.  5. Anemia stable at this time.  The patient is off all blood thinners and     anticoagulants.  6. Left hemiparesis, stable.  7. Congestive heart failure, stable.  8. Chronic atrial fibrillation with good rate control.  9. Chronic obstructive pulmonary disease on treatment.  10.      Gait instability.  11.      Hypertension.  12.      Type 2 diabetes controlled at this time.  13.      Osteoarthritis.  14.      Hyperlipidemia.   PROCEDURES:  Modified barium swallow and speech therapy consult.  This  showed that the patient does have some delayed swallow initiation and some  mild abnormalities, he was placed on a soft mechanical diet.  He will need  intermittent supervision with eating.  He will need to clear his throat  after every two sips of thin liquids, and he is to have his medicine be  given in puree or applesauce.   DISCHARGE MEDICATIONS:  1. Glucotrol XL 2.5 mg q.a.m.  2. He is to stay off aspirin and Coumadin.  3. He is also off is his Detrol LA and doxazosin and will remain off of     these for the time being.  4. Lasix is to be  decreased to 20 mg once daily.  5. Protonix 40 mg once daily.  6. Zetia 10 mg once daily.  7. Niaspan 500 mg each evening.  8. Advair 250/50 1 puff twice daily.  9. Iron 325 mg twice daily.  10.      Altace 10 mg daily.  11.      Tylenol as needed.  12.      Xopenex nebulizer as needed.  13.      Amiodarone 200 mg daily.  14.      Cipro 500 mg twice daily for three further days.   HISTORY OF PRESENT ILLNESS:  Mr. [name] is a 77 year old  male with a  complicated and active hospital history recently.  He was admitted in May  2005 with atrial fibrillation, COPD with COPD exacerbation, a microcytic  anemia and congestive heart failure.  During that hospitalization he was  cardioverted, but then reverted back to atrial fibrillation.  He had an EGD  and colonoscopy which were unremarkable.  He was treated for COPD and  congestive heart failure successfully.  He was discharged on Coumadin  therapy.  Unfortunately he was readmitted on June 10 with acute upper GI  bleed.  He received 6 units of packed red cells and his aspirin and Coumadin  were discontinued.  An EGD during that admission showed some possible  gastritis and a possible single varix.  When he was discharged and his Foley  catheter was removed, he did develop some gross blood from his penis.  He  then had some more difficulty with urination and had to return to the  emergency room where a Foley catheter was placed.  He was followed up in the  urology clinic.  He was reportedly placed on some antibiotics, however on  June 22 and 23 he developed increased weakness and lethargy and some  apparent worsening of his left-sided weakness.  He presented to the  emergency room on August 20, 2003 and was found to have an elevated white  count and a urinalysis suspicious for urinary tract infection.  He was given  antibiotics and discharged, however he returned back to the emergency room  within 12 hours with inability to swallow pills and  further weakness.  He  was therefore admitted for further care.   HOSPITAL COURSE:  Mr. [name] was admitted to a telemetry bed.  He had no  problem with congestive heart failure or COPD this admission.  He remained  in atrial fibrillation with a good rate control.  He was hydrated gently  with intravenous normal saline.  He was treated with IV ciprofloxacin.  Blood cultures times four remained negative during this admission.  A urine  culture this admission did grow 15,000 colonies of Klebsiella that was  sensitive to Cipro.  He was therefore continued on this antibiotic.  Physical therapy and occupational therapy evaluated the patient and felt  that he could go home with 24-hour supervision and ongoing home health  physical therapy and occupational therapy.  He did have some pill dysphagia  and therefore speech therapy was consulted with the results of their  evaluation as noted above.  By August 27, 2003 he was deemed stable for  discharge to home.   DISCHARGE PHYSICAL EXAM:  VITAL SIGNS:  Temperature 98.3, maximum  temperature 99.5, pulse 91, respiratory rate 20, blood sugars ranged from  133 to 157, he had 760 mL in, 1000 mL out, blood pressure was 147/81, 92%  saturation on room air.  GENERAL:  He was in no acute distress.  LUNGS:  Clear to auscultation bilaterally with decreased breath sounds  throughout which is his normal.  HEART:  Irregularly/irregular with no murmur, rub or gallop.  ABDOMEN:  Soft and nontender.  There was no peripheral edema.   DISCHARGE LABORATORY DATA:  White count 8.4 with a normal differential.  Hemoglobin 10.1 which is stable.  Platelet count 314,000.  Sodium 138,  potassium 4.1, chloride 106, CO2 25, BUN 11, creatinine 1.1, glucose 126 and  calcium 8.7.   Other notable discharge laboratory data include a CK of 327 with a normal MB  and normal troponin I of  0.03 on August 21, 2003.  DISCHARGE FOLLOWUP INSTRUCTIONS:  He is to call our office for a  follow-up  visit with me in 2-1/2 weeks.  He is to follow up with Dr. Juanda Chance in the GI  Clinic for a possible capsule endoscopy next week.  He is to keep his follow-  up visit with Dr. Brunilda Payor on September 03, 2003, and he is to see Dr. Donnie Aho in the  next month to help determine further treatment of his atrial fibrillation  and just what to do with his amiodarone therapy.  He or his family is to  call with  any problems.  If he has any more declines or acute problems he may require  inpatient rehab.  The family is very supportive and determined to take care  of him at home as much as possible, and they may even be able to bring a  person into the home if that is needed at some time.                                                Mark A. Waynard Edwards, M.D.    MAP/MEDQ  D:  08/27/2003  T:  08/27/2003  Job:  [id number]   cc:   Darden Palmer., M.D.  [address]  [place]  [place] [address]  Fax: [phone]   Lindaann Slough, M.D.  [address]  [place]  [place] [address]  Fax: [phone]   Lina Sar, M.D. [facility]
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
## 2010-07-15 NOTE — Consult Note (Signed)
NAME:  [name], [name] NO.:  [id number]   MEDICAL RECORD NO.:  [id number]                   PATIENT TYPE:  INP   LOCATION:  4714                                 FACILITY:  [facility]   PHYSICIAN:  Georgiana Spinner, M.D.                 DATE OF BIRTH:  01-Mar-1928   DATE OF CONSULTATION:  DATE OF DISCHARGE:                                   CONSULTATION   CONSULTING PHYSICIAN:  Georgiana Spinner, M.D.   [name] is a 77 year old gentleman who was admitted to the hospital  because of rectal bleeding.  The patient had been noticing black stools once  again prior to admission and was noted to have a hemoglobin and hematocrit  of 7.3 and 22.4.  He was admitted by Dr. Felipa Eth and transfusions were given,  and today his blood count has come up to hemoglobin of 8.6 and hematocrit of  25.6.  The patient denies abdominal pain or vomiting, and he can not give a  detailed history, presumably because of the stoke he has had in the past,  but he does believe he has been on aspirin and NSAIDs at home.  He was just  in the hospital here about a month ago, discharged.  At that time, his  discharge diagnoses were new onset atrial fibrillation.  He had been given  amiodarone and had subsequent cardioversion to a normal sinus rhythm;  however, atrial fibrillation recurred, and he was in atrial fibrillation at  the time of discharge.  He had congestive heart failure, felt secondary to  his atrial fibrillation and some volume overload, a history of chronic  obstructive lung disease which was exacerbated upon last admission, and a  new onset of microcytic anemia.  Colonoscopy and endoscopy were done on that  admission but apparently those were unremarkable.  He had a history of  stroke with left hemiparesis, hyperlipidemia, type 2 diabetes, hypertension,  history of osteoarthritis, and as noted, on his last admission he underwent  cardioversion.  He had an echocardiogram which showed an  ejection fraction  of 35-45%.   On discharge from the hospital at that time, he was discharged on:  1. Altace 10 mg daily.  2. Norvasc 10 mg daily.  3. Glucotrol XL 2.5 mg daily.  4. Aspirin 81 mg daily.  5. Detrol LA 4 mg daily.  6. Doxazosin 4 mg daily.  7. Furosemide.  8. Protonix 40 mg daily.  9. Zetia 10 mg daily.  10.      Niaspan 500 mg in the evening.  11.      Advair one puff twice a day.  12.      Potassium 20 mEq daily.  13.      Coumadin 10 mg daily.  14.      Ferrous sulfate twice daily.  15.      Amiodarone 200 mg daily.  16.  Atrovent.  17.      Xopenex nebulizer.  18.      Oxygen, continuous at 2 liters.   PHYSICAL EXAMINATION:  VITAL SIGNS:  Currently, his temperature is 97.6,  pulse 103, respiratory rate 18, blood pressure 127/83.  His O2 saturation  98% on 2 liters.  GENERAL:  Currently, he is a well developed well nourished man lying  comfortably in bed.  HEENT:  Without jaundice.  Thyroid was not enlarged.  I appreciated no  bruits.  LUNGS:  Clear.  HEART:  Sounds were somewhat distant but without murmur, gallop or rub  appreciated.  ABDOMEN:  Soft and nontender at this time.  RECTAL:  Reveals black stools that are heme positive.   LABORATORY STUDIES:  At this time show the hemoglobin of 8.6, hematocrit  25.6 with a platelet count of 103,000 today.  Electrolytes are normal.  Glucose 183, BUN is 18, creatinine 1.0, and liver function tests have all  been normal.  Albumin slightly low at 3.3.   IMPRESSION:  Given the black stools some of this could be due to iron  coloring the stool, although he is heme positive.  Certainly, an upper  gastrointestinal bleed has to be suspected.  He had an endoscopy a month  ago, and I think that it is reasonable to repeat that at this time.  If this  turns out to be negative, then a further evaluation with small bowel series,  enteroscopy, or capsule endoscopy may be necessary.  Will plan on doing  endoscopy.    IMPRESSION:                                               Georgiana Spinner, M.D.    GMO/MEDQ  D:  08/08/2003  T:  08/09/2003  Job:  [id number]   cc:   Loraine Leriche A. Waynard Edwards, M.D.  [address]  [place]  [place] [address]  Fax: [phone]
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
## 2010-07-15 NOTE — H&P (Signed)
NAME:  [name], [name]                         ACCOUNT NO.:  [id number]   MEDICAL RECORD NO.:  [id number]                   PATIENT TYPE:  INP   LOCATION:  4714                                 FACILITY:  [facility]   PHYSICIAN:  Larina Earthly, M.D.                     DATE OF BIRTH:  1928/08/04   DATE OF ADMISSION:  08/07/2003  DATE OF DISCHARGE:                                HISTORY & PHYSICAL   CHIEF COMPLAINT:  Weakness, shortness of breath, GI bleed.   HISTORY OF PRESENT ILLNESS:  This is a 77 year old African-American male who  was discharged from the hospital on Jul 09, 2003, secondary to new onset  atrial fibrillation, congestive heart failure presumably secondary to atrial  fibrillation, COPD, new microcytic anemia with unrevealing endoscopy and  colonoscopy in the setting of intermittent heme-positive stools.  He also  has a history of cerebrovascular accident with a history of left  hemiparesis, now mostly resolved, type 2 diabetes mellitus, hypertension,  history of statin-induced rhabdomyolysis, and multiple other  comorbidities.  Please see History and Physical and Discharge Summary from  hospitalization from May 2 through Jul 09, 2003.  At baseline, he does  require assistance with some of his ADLs; however, he is able to ambulate  with assistance of a cane per his wife and lives with his wife at home at  this time.   The patient was seen on Jul 16, 2003, in the office and was stable with a  good CBC revealing a hemoglobin of 11.6 and a hematocrit of 36.2%.  PT/INR  at that time was 3.92.  His basic metabolic panel was normal with a BUN of  16 and creatinine of 1.0.  The patient was seen on August 05, 2003, by  medication resource management at our office and was found to have a PT/INR  of 8.0 but did not have any bleeding complications at the time and was  hemodynamically stable.  His Coumadin was held, and recheck was pending on  August 07, 2003.  In the last several days per  both the patient and wife,  however, the history is somewhat vague, the patient describes multiple dark  stools, increasing shortness of breath, and weakness, and he subsequently  presented to the emergency room for further evaluation where he was found to  have normal oxygen saturation but hemoglobin of 7.3 and hematocrit 22.4% and  black stool per rectum by the ER physician.  His systolic blood pressure was  92, and he was started on IV fluids as well as packed red blood cell  transfusion.  His systolic blood pressure increased to 125, and now he is  admitted for further evaluation and management.  His PT/INR in the emergency  room is 3.9.   REVIEW OF SYSTEMS:  Negative for fevers, chills, chest pain, nausea,  vomiting, abdominal discomfort, new musculoskeletal or neurological deficits  but positive for multiple stools that were black but of vague duration and  timing, and positive for shortness of breath and diffuse weakness.   PAST MEDICAL HISTORY:  1. Recent new onset atrial fibrillation status post amiodarone load and     cardioversion with conversion to normal sinus rhythm; however, the     patient subsequently returned to atrial fibrillation.  2. Congestive heart failure, multifactorial but possibly related to atrial     fibrillation and reduced ejection fraction.  3. COPD with history of acute COPD flare.  4. Microcytic anemia with unremarkable colonoscopy and EGD May 2005 but with     intermittent heme-positive stools.  5. History of past stroke with left-sided hemiparesis with minimal residual     deficits at this time.  6. Dyslipidemia with history of statin-induced rhabdomyolysis.  7. Type 2. Diabetes mellitus.  8. Hypertension.  9. Osteoarthritis.  10.      History of left shoulder shrapnel injury.  11.      History of left arm fracture.  12.      Hypokalemia, now resolved.  13.      Ejection fraction 35 to 45%.   CURRENT MEDICATIONS:  As best can be ascertained in  the emergency room from  the patient includes:  1. Norvasc 10 mg p.o. daily.  2. Glucotrol XL 2.5 mg p.o. daily.  3. Aspirin 81 mg p.o. daily.  4. Detrol LA 4 mg p.o. daily.  5. Doxazosin 4 mg p.o. daily.  6. Senokot p.r.n.  7. Altace 10 mg p.o. daily.  8. Furosemide 40 mg p.o. daily.  9. Protonix 40 mg p.o. daily.  10.      Coumadin as directed but currently on hold.  11.      Amiodarone 200 mg each day.  12.      Zetia 10 mg each day.  13.      Niaspan 500 mg p.o. q.h.s.  14.      Iron sulfate 1 tablet p.o. b.i.d.  15.      Atrovent and Xopenex nebulizers t.i.d.  16.      Oxygen at 2 liters continuously; however, wife states that he is     not using oxygen at home.  17.      Advair 250/50 one puff b.i.d.   ALLERGIES:  no known drug allergies, but rhabdomyolysis to ZOCOR in the  past.   SOCIAL HISTORY:  The patient is married.  Wife, Britta Mccreedy, is very supportive.  They have been married since 1997.  Has one son and three grandchildren.  Worked for the [employer] and then the [employer].  Has  50-pack-year  history of smoking but quit in 1996.  Has no use of alcohol or drug use in  the past.   FAMILY HISTORY:  Significant for myocardial infarction, diabetes, and  amputation.   LABORATORY DATA:  CK-MB less than 1.0, troponin I less than 0.05.  PT/INR  3.9, PTT 35 seconds.  BNP 79.7.  White blood cell count 7.4, hemoglobin 7.3,  hematocrit 22.4%, platelet count 117.  Sodium 135, potassium 4.5, BUN 20,  creatinine 1.1, glucose 143.  AST 20, ALT 25, alkaline phosphatase 56, total  bilirubin 0.5, albumin 3.3.  Urinalysis unremarkable.   PHYSICAL EXAMINATION:  GENERAL:  We have a pleasant, alert and oriented x 3  African-American male lying in bed flat in no apparent distress with oxygen  in place.  VITAL SIGNS:  Blood pressure 109/55, oxygen saturation 100% on  3 liters by nasal cannula.  Respirations 16, pulse 86, with atrial fibrillation on  telemetry.  HEENT:  Sclerae  anicteric.  There are no oropharyngeal lesions.  NECK:  Supple.  There is no JVD.  There is no cervical lymphadenopathy.  LUNGS:  Clear to auscultation bilaterally with decreased breath sounds at  the bases.  CARDIOVASCULAR:  Irregularly irregular rhythm.  ABDOMEN:  Soft, nontender, nondistended abdomen.  Bowel sounds are present.  RECTAL:  Per ER physician reveals black stool.  EXTREMITIES:  No peripheral edema is present.  Pedal pulses are intact.  NEUROLOGIC:  The patient is able to move all four extremities against  gravity and follow directions.   IMPRESSION AND PLAN:  1. Microcytic anemia with MCV of 84 with guaiac-positive black stools in the     emergency room, presumably secondary to gastrointestinal bleed in the     setting of increased PT/INR with no clinical evidence of brisk     gastrointestinal bleed at this time.  We will admit and transfuse with     packed red blood cells, obtain serial CBCs, change proton pump inhibitor     to twice daily.  Will obtain GI consult, Dr. Virginia Rochester, who is on call for     [facility].  Currently the patient is hemodynamically stable with packed     red blood cell transfusion and IV fluids ongoing.  Will also continue     diet of clear liquids in the event of any intervention planned by Dr.     Virginia Rochester.  2. Atrial fibrillation.  Currently hemodynamically stable. Will continue     amiodarone.  Currently the patient is rate controlled.  Will watch for     any evidence of volume overload given marginally low ejection fraction.     Will maintain strict I's and O's.  3. Anticoagulation.  Will hold Coumadin given gastrointestinal bleed.  4. Hypertension.  Will hold antihypertensives pending the outcome of the GI     bleed with initial low systolic blood pressure.  5. Chronic obstructive pulmonary disease.  Will continue nebulizers and     Advair.  No bronchospasm on exam at the current time.  6. Type 2 diabetes mellitus.  Will continue sliding scale insulin  and hold     sulfonylurea given a decrease in diet orders with clear fluids at this     time.                                                Larina Earthly, M.D.    RA/MEDQ  D:  08/07/2003  T:  08/08/2003  Job:  [id number]   cc:   Loraine Leriche A. Waynard Edwards, M.D.  [address]  [place]  [place] [address]  Fax: [phone]   W. Ashley Royalty., M.D.  [address], [address]  [place]  [place] [address]  Fax: [phone]   Lina Sar, M.D. [facility]   Georgiana Spinner, M.D.  [address]  [place]  [place] [address]  Fax: [phone]

## 2010-07-15 NOTE — Discharge Summary (Signed)
NAME:  [name], [name]                         ACCOUNT NO.:  [id number]   MEDICAL RECORD NO.:  [id number]                   PATIENT TYPE:  EMS   LOCATION:  MAJO                                 FACILITY:  [facility]   PHYSICIAN:  Mark A. Perini, M.D.                DATE OF BIRTH:  Apr 29, 1928   DATE OF ADMISSION:  08/15/2003  DATE OF DISCHARGE:  08/15/2003                                 DISCHARGE SUMMARY   DISCHARGE DIAGNOSES:  1. Acute gastrointestinal bleeding, presumed upper gastrointestinal source     in the setting of supratherapeutic INR on Coumadin with evidence of     possible gastritis or possible single Barrett's by     esophagogastroduodenoscopy, status post six units total transfusion of     packed red cells.  2. Chronic atrial fibrillation.  3. Hypertension.  4. Type 2 diabetes.  5. Chronic obstructive pulmonary disease.  6. History of congestive heart failure, stable at this time.  7. Atherosclerotic cerebrovascular disease with history of left hemiplegia.  8. Thrombocytopenia.  9. Anemia of acute blood loss.   PROCEDURES:  Gastrointestinal consultation with subsequent upper  esophagogastroduodenoscopy with the findings of mild erythema of the gastric  fundus which was biopsied and a questionable single varix in the distal  esophagus of unknown significance, otherwise unremarkable  esophagogastroduodenoscopy.  Also, a small bowel follow through Gastrografin  study was performed which showed no focal abnormality.   DISCHARGE MEDICATIONS:  1. He is to resume Glucotrol XL 2.5 mg each morning.  2. He is to stop aspirin and Coumadin indefinitely.  3. He is to resume Detrol LA 4 mg daily in three days if his urine function     is stable.  If he is not having any urinary issues, he is to continue to     hold off on the Detrol LA.  4. Doxazosin 4 mg; he is to reduce this to just 1/2 tablet daily.  5. He is to resume potassium at previous dose.  6. He is to resume furosemide  at previous dose.  7. He is to resume Protonix 40 mg daily.  8. He is to resume Zetia 10 mg daily.  9. Niaspan 500 mg each evening.  10.      He is to continue Advair 250/50 one puff twice daily.  11.      Ferrous sulfate 325 mg twice daily.  12.      Amiodarone 200 mg once daily.  13.      He is to resume Atrovent and Xopenex nebulizer treatments twice     daily as before.  14.      He is not to be on home oxygen at this time.  15.      He is to have Tylenol as needed for pain.  16.      He is to resume Altace 10 mg daily.  17.  He is to stop Norvasc.   HISTORY OF PRESENT ILLNESS:  Mr. [name] is a 77 year old male who was  discharged on Jul 09, 2003, after an admission which included atrial  fibrillation, congestive heart failure, COPD exacerbation and a new  microcytic anemia.  At that time, he had endoscopy and colonoscopy which in  the setting of intermittent heme-positive stools which were fairly  unrevealing.  He was treated with Coumadin and discharged home.  He was  doing well.  However, despite close INR monitoring he did have an INR of 8.0  in our office on August 05, 2003.  His Coumadin was held and rechecked on August 07, 2003.  However, he developed significant multiple dark stools and  increasing shortness of breath and weakness.  In the emergency room, he was  found to have a hemoglobin of 7.3 and black stools.  He was admitted for  further evaluation and treatment.   HOSPITAL COURSE:  Mr. [name] was admitted to a stepdown bed.  He did require  a total of 6 units of packed red cells this admission.  His anticoagulation  was reversed with vitamin K and FFP.  Gastroenterology was consulted, and  their help was appreciated.  They performed the above studies.  There was no  further bleeding noted.  Mr. [name] remained hemodynamically stable this  admission.  He had no exacerbation of CHF or COPD.  He remained in chronic  atrial fibrillation this admission with good heart rate  control.  By August 14, 2003, his hemoglobin was stable, and he was deemed stable for discharge  home.   DISCHARGE PHYSICAL EXAMINATION:  Afebrile, temperature 98.7, pulse 88,  respiratory rate 16, blood pressure 129/82, 96% saturation on room air.  Blood sugars range from 102-150.  He had balanced ins and outs.  He is in no  acute distress.  Lungs were clear to auscultation bilaterally, albeit with  distant breath sounds which is normal for him.  There were no wheezes or  rales.  Heart was irregularly irregular with no definite murmur heard.  Abdomen was soft and nontender.  There was no significant edema.   DISCHARGE LABORATORY DATA:  White count 7.9, hemoglobin 9.9, INR 1.1, sodium  137, potassium 3.8, chloride 108, CO2 25, BUN 10, creatinine 0.8, glucose  127.  Liver tests were normal with the exception of an albumin of 2.8.   DISCHARGE INSTRUCTIONS:  Mr. [name] is to be up as tolerated.  He is to  follow a low fat, low salt, diabetic diet.  He is to call if he has any  recurrent problems.  He is to check his blood pressure three times a week  and record them and check his blood sugars once daily each morning.  He is  to have home health physical therapy to evaluate and treat as he has lost  some ground with this one week hospitalization.  He is also to have home  health RN to follow with medical compliance and continue to monitor his COPD  and CHF.  He is scheduled for a capsule endoscopy on September 02, 2003, at 7:30  in the morning at [facility].  He is to follow up with Dr.  Juanda Chance on September 22, 2003, at 10:45 a.m.  He is to call our office for a  followup visit with me in two weeks and is to return to our office next  Tuesday for a CBC, differential and C-MET.  Mark A. Waynard Edwards, M.D.    MAP/MEDQ  D:  08/14/2003  T:  08/16/2003  Job:  [id number]   cc:   Lina Sar, M.D. [facility]   W. Ashley Royalty., M.D. [address]  [place]  [place] [address]  Fax: [phone]

## 2010-07-15 NOTE — Consult Note (Signed)
NAME:  [name], [name]                         ACCOUNT NO.:  [id number]   MEDICAL RECORD NO.:  [id number]                   PATIENT TYPE:  INP   LOCATION:  3702                                 FACILITY:  [facility]   PHYSICIAN:  W. Ashley Royalty., M.D.         DATE OF BIRTH:  Jan 22, 1929   DATE OF CONSULTATION:  09/26/2001  DATE OF DISCHARGE:                              CARDIOLOGY CONSULTATION   REASON FOR CONSULTATION:  Thank you for asking me to see this very nice 77-  year-old male. He has a prior history of long standing hypertension and type  II diabetes mellitus and hyperlipidemia. He had a cerebral vascular accident  in 1997 and he has been treated with aspirin since then. He has been left  with significant left hemiparesis but is able to take care of most of his  needs at home. He was admitted with urinary urgency, frequency, diarrhea,  and a normal temperature of 102 and dehydration. He was admitted for further  evaluation on September 24, 2001. His medical condition has improved somewhat  since admission except for recurrent temperature. He developed atrial  fibrillation yesterday with a somewhat rapid response. He was treated with  IV Diltiazem and was placed on Lovenox. The patient is not aware that he has  been in atrial fibrillation. Denies shortness of breath or chest pain. I was  asked to see the patient to help with management today.   PAST MEDICAL HISTORY:  Remarkable for hypertension, type II diabetes,  hyperlipidemia, spinal stenosis, and benign prostatic hypertrophy. He had  previous cerebral vascular accident as noted previously.   ALLERGIES:  None.   CURRENT MEDICATIONS:  Norvasc 10 mg daily, Altace 10 mg daily, Glucotrol XL  5 mg in the morning, aspirin 81 mg daily, Detrol LA 4 mg daily, Cardura 4 mg  daily, Zocor 20 mg QHS, and Senokot as needed.   FAMILY HISTORY:  Positive for myocardial infarction in father who died in  his 48's. Brief history of peripheral  vascular disease and diabetes.   SOCIAL HISTORY:  He is married to his second wife since 68. One sone and  three grandchildren. Retired from [employer] and later the [employer]. No alcohol and he quit smoking in 1976.   REVIEW OF SYSTEMS:  Except as noted above, otherwise unremarkable.   PHYSICAL EXAMINATION:  GENERAL: He is a pleasant, elderly male who is  currently in no acute distress.  VITAL SIGNS: Blood pressure 135/80. Pulse 110 and irregular.  SKIN: Warm and dry.  HEENT: No JVD, thyromegaly, or mass. No carotid bruits.  LUNGS: Clear. No rales.  CARDIAC: Irregular rhythm. Normal S1 and S2. There is no S3.  ABDOMEN: Soft and nontender. No hepatosplenomegaly or masses.  EXTREMITIES: Femoral pulse 2+, peripheral pulse 1+. No edema noted.   DIAGNOSTIC STUDIES:  A 12 lead ECG shows atrial fibrillation. Non-specific  ST and T wave abnormality with  somewhat rapid response.   LABORATORY DATA:  Elevated CPK total with minimal MB. White count was 20,000  on admission. Troponin is 0.1 which is borderline. Glucose is 142, BUN 26  with creatinine of 1.4 on admission and fell to  21 and 1.2 the next day.   IMPRESSION:  1. Atrial fibrillation, new onset, likely due to the stress of the recent     dehydration, upper respiratory illness, plus fever. He also may have a     contribution of hypertensive heart disease.  2. Hypertensive heart disease.  3. Type II diabetes.  4. Hyperlipidemia.  5. Old cerebral vascular accident with left hemiparesis.   RECOMMENDATIONS:  Obtain echocardiogram and TSH. Rate control with beta  blockers and IV Diltiazem. He is currently on Lovenox. He likely will need  to go ahead and be anticoagulated with Coumadin because of his atrial  fibrillation history as well as his multiple risk factors for embolus and  high risk for recurrence of atrial fibrillation down the road. I would  probably go ahead and start Coumadin in the morning and will  discuss this  after consultation with you.                                                Darden Palmer., M.D.    WST/MEDQ  D:  09/26/2001  T:  10/02/2001  Job:  [id number]   cc:   Loraine Leriche A. Perini, M.D.
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
## 2010-07-15 NOTE — H&P (Signed)
NAME:  [name], [name]                         ACCOUNT NO.:  [id number]   MEDICAL RECORD NO.:  [id number]                   PATIENT TYPE:  INP   LOCATION:  3703                                 FACILITY:  [facility]   PHYSICIAN:  Mark A. Perini, M.D.                DATE OF BIRTH:  [birth year]   DATE OF ADMISSION:  08/21/2003  DATE OF DISCHARGE:                                HISTORY & PHYSICAL   CHIEF COMPLAINT:  Weakness.   HISTORY OF PRESENT ILLNESS:  [name] is a pleasant 77 year old male who  has had an active hospital history lately.  He was initially admitted in May  2005 with atrial fibrillation, COPD with COPD exacerbation, macrocytic  anemia, and congestive heart failure.  During that hospital course he was  cardioverted and then reverted back to atrial fibrillation.  He had an EGD  and colonoscopy, which were relatively unremarkable.  He was treated for his  COPD and congestive heart failure successfully.  He was discharged on  Coumadin.  Unfortunately he was readmitted on August 07, 2003 for an acute  upper GI bleed.  He received six units of packed red cells during this  admission.  His aspirin and Coumadin were stopped.  He did have an EGD  during that admission that showed possible gastritis and a possible single  varix.  At the time of discharge his Foley catheter was removed.  He was  noted to have some gross blood from his penis at that time.  He then passed  more urine later that afternoon and was deemed stable for discharge home.  However, he developed more aggressive hematuria into the next day after  discharge on August 16, 2003.  He presented to the emergency room where a  Foley catheter was replaced.  He subsequently followed up in the urology  clinic this week.  Per the family the catheter as removed; however, into the  next day, into Wednesday and Thursday of this week, June 22nd and 23rd, he  developed increasing weakness and lethargy.  He presented to the emergency  room on the evening of June 23rd and was found to have an elevated white  count, and urinalysis suspicious for urinary tract infection.  According to  the family he was given antibiotics and discharged.  However, quickly upon  going home he was found to be too weak to managed at home.  Furthermore he  had inability to swallow his pills.  He was therefore referred back to the  emergency room and will require admission for further care.   PAST MEDICAL HISTORY:  1. Recent acute upper GI bleeding; please see discharge summary dated June     18th for the details of that seven-day admission.  2. Chronic atrial fibrillation.  3. Hypertension.  4. Type II diabetes.  5. Chronic obstructive pulmonary disease.  6. Congestive heart failure.  7. Atherosclerotic cerebrovascular disease with  a history of a stroke with     left hemiparesis.  8. Thrombocytopenia.  9. Anemia.  10.      Ejection fraction of 35-45%.  11.      History of left arm fracture.  12.      History of left shoulder shrapnel injury with remaining shrapnel in     his left shoulder.  13.      Osteoarthritis.  14.      Hyperlipidemia.   ALLERGIES:  ZOCOR causes rhabdomyolysis one time in the past a few years  ago.   MEDICATIONS:  1. Glucotrol XL 2.5 mg q.a.m.  2. The patient has been off aspirin and Coumadin for the last two weeks.  3. Detrol LA 4 mg daily.  4. Doxazosin 4 mg daily.  5. Potassium 20 mEq daily,  6. Lasix 40 mg daily.  7. Protonix 40 mg daily.  8. Zetia 10 mg daily,  9. Niaspan 500 mg each evening.  10.      Advair 250/50 1 puff twice daily.  11.      Ferrous sulfate 325 mg b.i.d.  12.      Amiodarone 200 mg daily.  13.      Altace 10 mg daily.  14.      Tylenol as needed.  15.      Xopenex nebulizers as needed.   SOCIAL HISTORY:  [name] is married; his wife, [name], is very supportive.  They have been married since 1997.  He has one sone and three grandchildren.  He worked for the [employer] and then  for the [employer].  He has a 50  pack/year smoking history, but quit in 1996.  No alcohol or drug use  history.   FAMILY HISTORY:  Family history is significant for myocardial infarction,  diabetes and amputation.   REVIEW OF SYSTEMS:  [name] does report increased urinary frequency and  new urinary incontinence over the last few days.  The urine is sometimes  darker, sometimes more yellow-colored.  He has had a lot of excess urine  output.  He has been wearing Depend diapers for the last few days.  He has  had some few and was noted to have a temperature of 101.4 in the emergency  room yesterday.  He was given an undisclosed antibiotic.  Possibly today he  had trouble swallowing.  His left-sided weakness according to him is at its  baseline.  He denies any chest pains or shortness of breath.  There has been  no new blood noted in the last few days from above or below.   PHYSICAL EXAMINATION:  VITAL SIGNS:  Temperature 99.2, blood pressure  129/64, pulse 106, respiratory rate 18, and 96% saturation on room air.  GENERAL APPEARANCE:  The patient is in no acute distress lying in  semisupine.  HEENT:  The patient possibly has some mild icterus versus just muddy  sclerae.  There is no pallor.  NECK:  There is no JVD.  LUNGS:  There are decreased breath sounds bilaterally with no wheezes, rales  or rhonchi; and, this is his baseline lung exam.  HEART:  Heart is irregularly irregular with no murmur, rub or gallop.  ABDOMEN:  Abdomen is soft and nontender with no mass or hepatosplenomegaly.  EXTREMITIES:  The patient has stable left-sided weakness of his arm and leg.  There is no significant peripheral edema.   LABORATORY DATA:  Chest x-ray shows no acute disease with maybe some mild  atelectasis at the bases.  EKG shows atrial fibrillation with normal heart  rate and nonspecific lateral ST and T changes.  Troponin I 0.03.  CK of 327 with an MB of 2.1.  The pH is 7.395, pCO2 37 and  bicarb is 23.  Hemoglobin  is 9.5, platelet count 210,000, white count is 25,000 with 92% segs, 5%  lymphocytes and 3% monocytes.  Urinalysis shows rare squamous cells, 11-20  white cells, 7-10 red cells and rare bacteria, specific gravity is 1,023,  there is moderate blood and small leukocytes noted.  Sodium is 134,  potassium 3.9, chloride 109, CO2 25, BUN 13, creatinine 1.2, and glucose  121.  Calcium 7.7.  His white count was 7.6 one week ago before discharge.  His hemoglobin was 9.3 at the time of discharge last week.  His baseline  creatinine is 1.1 from his discharge last week.   ASSESSMENT AND PLAN:  [age]-year-old male with elevated temperature,  leukocytosis and probable urinary tract infection.  I believe that all this  is a complication from his Foley catheter from his one-week hospitalization  earlier in the month of June.   1. We will admit him.  2. We will hydrate with intravenous normal saline.  3. We will treat him with IV Cipro.  4. We will attempt to get a urine culture and blood culture, although the     results of this will probably be less sensitive due to recent     antibiotics.  5. We will try to avoid replacing the Foley catheter if at all possible.  6. We will check liver function tests.  7. We will keep him off aspirin and Coumadin for now given his recent GI     bleeding.  8. We will continue his other medications; however, we will hold his     Doxazosin and Detrol LA for the time being.  9. Furthermore, we will reduce his dose of Altace to 5 mg daily.  10.      He is a Full Code status.  11.      We will attempt to give him a soft mechanical diet and observe him     for any signs of dysphagia.  He may need a swallowing study if this is     seen.  12.      We will place him on a sliding scale of Novolog insulin for his     diabetes.  13.      We will continue amiodarone for now as it is at least helping with     rate control; however, we may need  to consult his cardiologist this     admission to see if different course of action is needed for his chronic     atrial fibrillation.  14.      We will continue Lasix at a slightly reduced dose of 20 mg daily     and continue his potassium chloride as well.  15.      We will continue Protonix 40 mg daily for GI protection.  16.      We will continue iron supplementation for his anemia.  17.      We will place him on PAS hose for DVT prophylaxis.  17\8.  We will also continue treatment with Advair and as needed Xopenex  for his COPD.  Mark A. Waynard Edwards, M.D.    MAP/MEDQ  D:  08/21/2003  T:  08/21/2003  Job:  [id number]   cc:   Lindaann Slough, M.D.  [address]  [place]  [place] [address]  Fax: [phone]   W. Ashley Royalty., M.D.  [address], [address]  [place]  [place] [address]  Fax: [phone]

## 2010-08-03 ENCOUNTER — Emergency Department (HOSPITAL_COMMUNITY)
Admission: EM | Admit: 2010-08-03 | Discharge: 2010-08-03 | Disposition: A | Discharge status: Home / Self Care | Payer: Medicare Other | Attending: Emergency Medicine | Admitting: Emergency Medicine

## 2010-08-03 ENCOUNTER — Emergency Department (HOSPITAL_COMMUNITY): Payer: Medicare Other

## 2010-08-03 DIAGNOSIS — M25569 Pain in unspecified knee: Secondary | ICD-10-CM | POA: Insufficient documentation

## 2010-08-03 DIAGNOSIS — W010XXA Fall on same level from slipping, tripping and stumbling without subsequent striking against object, initial encounter: Secondary | ICD-10-CM | POA: Insufficient documentation

## 2010-08-03 DIAGNOSIS — E119 Type 2 diabetes mellitus without complications: Secondary | ICD-10-CM | POA: Insufficient documentation

## 2010-08-03 DIAGNOSIS — Y92009 Unspecified place in unspecified non-institutional (private) residence as the place of occurrence of the external cause: Secondary | ICD-10-CM | POA: Insufficient documentation

## 2010-08-03 DIAGNOSIS — M25579 Pain in unspecified ankle and joints of unspecified foot: Secondary | ICD-10-CM | POA: Insufficient documentation

## 2010-08-03 DIAGNOSIS — R29898 Other symptoms and signs involving the musculoskeletal system: Secondary | ICD-10-CM | POA: Insufficient documentation

## 2010-08-03 DIAGNOSIS — I498 Other specified cardiac arrhythmias: Secondary | ICD-10-CM | POA: Insufficient documentation

## 2010-08-03 DIAGNOSIS — Z8673 Personal history of transient ischemic attack (TIA), and cerebral infarction without residual deficits: Secondary | ICD-10-CM | POA: Insufficient documentation

## 2010-08-03 DIAGNOSIS — I10 Essential (primary) hypertension: Secondary | ICD-10-CM | POA: Insufficient documentation

## 2010-08-03 LAB — BASIC METABOLIC PANEL
GFR calc non Af Amer: >60 mL/min (ref >60)
Glucose, Bld: 103 mg/dL — ABNORMAL HIGH (ref 70–99)
Potassium: 4.1 mEq/L (ref 3.5–5.1)
Sodium: 140 mEq/L (ref 135–145)

## 2010-08-11 ENCOUNTER — Inpatient Hospital Stay (HOSPITAL_COMMUNITY)
Admission: EM | Admit: 2010-08-11 | Discharge: 2010-08-17 | DRG: 243 | Disposition: A | Discharge status: Skilled Nursing Facility | Payer: Medicare Other | Attending: Internal Medicine | Admitting: Internal Medicine

## 2010-08-11 DIAGNOSIS — F4310 Post-traumatic stress disorder, unspecified: Secondary | ICD-10-CM | POA: Diagnosis present

## 2010-08-11 DIAGNOSIS — I495 Sick sinus syndrome: Secondary | ICD-10-CM | POA: Diagnosis present

## 2010-08-11 DIAGNOSIS — R5381 Other malaise: Secondary | ICD-10-CM | POA: Diagnosis present

## 2010-08-11 DIAGNOSIS — I119 Hypertensive heart disease without heart failure: Secondary | ICD-10-CM | POA: Diagnosis present

## 2010-08-11 DIAGNOSIS — E119 Type 2 diabetes mellitus without complications: Secondary | ICD-10-CM | POA: Diagnosis present

## 2010-08-11 DIAGNOSIS — J449 Chronic obstructive pulmonary disease, unspecified: Secondary | ICD-10-CM | POA: Diagnosis present

## 2010-08-11 DIAGNOSIS — IMO0002 Reserved for concepts with insufficient information to code with codable children: Secondary | ICD-10-CM | POA: Diagnosis present

## 2010-08-11 DIAGNOSIS — I5022 Chronic systolic (congestive) heart failure: Secondary | ICD-10-CM | POA: Diagnosis present

## 2010-08-11 DIAGNOSIS — I714 Abdominal aortic aneurysm, without rupture, unspecified: Secondary | ICD-10-CM | POA: Diagnosis present

## 2010-08-11 DIAGNOSIS — G609 Hereditary and idiopathic neuropathy, unspecified: Secondary | ICD-10-CM | POA: Diagnosis present

## 2010-08-11 DIAGNOSIS — E785 Hyperlipidemia, unspecified: Secondary | ICD-10-CM | POA: Diagnosis present

## 2010-08-11 DIAGNOSIS — K5909 Other constipation: Secondary | ICD-10-CM | POA: Diagnosis present

## 2010-08-11 DIAGNOSIS — I4891 Unspecified atrial fibrillation: Principal | ICD-10-CM | POA: Diagnosis present

## 2010-08-11 DIAGNOSIS — M1990 Unspecified osteoarthritis, unspecified site: Secondary | ICD-10-CM | POA: Diagnosis present

## 2010-08-11 DIAGNOSIS — Z7982 Long term (current) use of aspirin: Secondary | ICD-10-CM

## 2010-08-11 DIAGNOSIS — I739 Peripheral vascular disease, unspecified: Secondary | ICD-10-CM | POA: Diagnosis present

## 2010-08-11 DIAGNOSIS — M1710 Unilateral primary osteoarthritis, unspecified knee: Secondary | ICD-10-CM | POA: Diagnosis present

## 2010-08-11 DIAGNOSIS — M4800 Spinal stenosis, site unspecified: Secondary | ICD-10-CM | POA: Diagnosis present

## 2010-08-11 DIAGNOSIS — I359 Nonrheumatic aortic valve disorder, unspecified: Secondary | ICD-10-CM | POA: Diagnosis present

## 2010-08-11 DIAGNOSIS — E669 Obesity, unspecified: Secondary | ICD-10-CM | POA: Diagnosis present

## 2010-08-11 DIAGNOSIS — I509 Heart failure, unspecified: Secondary | ICD-10-CM | POA: Diagnosis present

## 2010-08-11 DIAGNOSIS — N400 Enlarged prostate without lower urinary tract symptoms: Secondary | ICD-10-CM | POA: Diagnosis present

## 2010-08-11 DIAGNOSIS — H9190 Unspecified hearing loss, unspecified ear: Secondary | ICD-10-CM | POA: Diagnosis present

## 2010-08-11 DIAGNOSIS — I69959 Hemiplegia and hemiparesis following unspecified cerebrovascular disease affecting unspecified side: Secondary | ICD-10-CM

## 2010-08-11 DIAGNOSIS — Z9181 History of falling: Secondary | ICD-10-CM

## 2010-08-11 DIAGNOSIS — R627 Adult failure to thrive: Secondary | ICD-10-CM | POA: Diagnosis present

## 2010-08-11 DIAGNOSIS — J4489 Other specified chronic obstructive pulmonary disease: Secondary | ICD-10-CM | POA: Diagnosis present

## 2010-08-11 LAB — URINALYSIS, ROUTINE W REFLEX MICROSCOPIC
Bilirubin Urine: NEGATIVE
Hgb urine dipstick: NEGATIVE
Specific Gravity, Urine: 1.015 (ref 1.005–1.030)
pH: 5.5 (ref 5.0–8.0)

## 2010-08-11 LAB — CBC
HCT: 39.3 % (ref 39.0–52.0)
MCV: 93.1 fL (ref 78.0–100.0)
RBC: 4.22 MIL/uL (ref 4.22–5.81)
WBC: 5.2 10*3/uL (ref 4.0–10.5)

## 2010-08-11 LAB — BASIC METABOLIC PANEL
BUN: 15 mg/dL (ref 6–23)
CO2: 27 mEq/L (ref 19–32)
Chloride: 103 mEq/L (ref 96–112)
Creatinine, Ser: 0.92 mg/dL (ref 0.4–1.5)
Glucose, Bld: 87 mg/dL (ref 70–99)

## 2010-08-11 LAB — CK TOTAL AND CKMB (NOT AT ARMC)
CK, MB: 2.3 ng/mL (ref 0.3–4.0)
Relative Index: 1.8 (ref 0.0–2.5)

## 2010-08-11 LAB — DIFFERENTIAL
Eosinophils Relative: 9 % — ABNORMAL HIGH (ref 0–5)
Lymphocytes Relative: 45 % (ref 12–46)
Lymphs Abs: 2.4 10*3/uL (ref 0.7–4.0)

## 2010-08-12 ENCOUNTER — Inpatient Hospital Stay (HOSPITAL_COMMUNITY): Payer: Medicare Other

## 2010-08-12 DIAGNOSIS — I498 Other specified cardiac arrhythmias: Secondary | ICD-10-CM

## 2010-08-12 LAB — BASIC METABOLIC PANEL
BUN: 11 mg/dL (ref 6–23)
CO2: 27 mEq/L (ref 19–32)
Chloride: 105 mEq/L (ref 96–112)
GFR calc Af Amer: >60 mL/min (ref >60)
Potassium: 3.6 mEq/L (ref 3.5–5.1)

## 2010-08-12 LAB — CBC
HCT: 37.2 % — ABNORMAL LOW (ref 39.0–52.0)
Hemoglobin: 12.8 g/dL — ABNORMAL LOW (ref 13.0–17.0)
MCHC: 34.4 g/dL (ref 30.0–36.0)
MCV: 92.5 fL (ref 78.0–100.0)
RDW: 13.7 % (ref 11.5–15.5)

## 2010-08-12 LAB — TSH: TSH: 2.357 u[IU]/mL (ref 0.350–4.500)

## 2010-08-12 LAB — INSULIN-LIKE GROWTH FACTOR: Somatomedin C: 127 ng/mL (ref 35–196)

## 2010-08-12 LAB — GLUCOSE, CAPILLARY: Glucose-Capillary: 127 mg/dL — ABNORMAL HIGH (ref 70–99)

## 2010-08-12 LAB — PROTIME-INR: INR: 1.07 (ref 0.00–1.49)

## 2010-08-13 LAB — CBC
Platelets: 119 10*3/uL — ABNORMAL LOW (ref 150–400)
RBC: 3.95 MIL/uL — ABNORMAL LOW (ref 4.22–5.81)
RDW: 13.7 % (ref 11.5–15.5)
WBC: 5 10*3/uL (ref 4.0–10.5)

## 2010-08-13 LAB — COMPREHENSIVE METABOLIC PANEL
ALT: 9 U/L (ref 0–53)
AST: 16 U/L (ref 0–37)
Albumin: 3.1 g/dL — ABNORMAL LOW (ref 3.5–5.2)
Alkaline Phosphatase: 66 U/L (ref 39–117)
Chloride: 105 mEq/L (ref 96–112)
Potassium: 3.7 mEq/L (ref 3.5–5.1)
Total Bilirubin: 0.3 mg/dL (ref 0.3–1.2)

## 2010-08-13 LAB — GLUCOSE, CAPILLARY: Glucose-Capillary: 111 mg/dL — ABNORMAL HIGH (ref 70–99)

## 2010-08-13 LAB — DIFFERENTIAL
Basophils Absolute: 0 10*3/uL (ref 0.0–0.1)
Eosinophils Absolute: 0.5 10*3/uL (ref 0.0–0.7)
Eosinophils Relative: 10 % — ABNORMAL HIGH (ref 0–5)
Lymphocytes Relative: 39 % (ref 12–46)
Neutrophils Relative %: 40 % — ABNORMAL LOW (ref 43–77)

## 2010-08-14 LAB — DIFFERENTIAL
Basophils Absolute: 0 10*3/uL (ref 0.0–0.1)
Lymphocytes Relative: 33 % (ref 12–46)
Monocytes Absolute: 0.6 10*3/uL (ref 0.1–1.0)
Monocytes Relative: 10 % (ref 3–12)
Neutro Abs: 2.7 10*3/uL (ref 1.7–7.7)

## 2010-08-14 LAB — COMPREHENSIVE METABOLIC PANEL
Alkaline Phosphatase: 73 U/L (ref 39–117)
BUN: 14 mg/dL (ref 6–23)
Chloride: 104 mEq/L (ref 96–112)
GFR calc Af Amer: >60 mL/min (ref >60)
GFR calc non Af Amer: >60 mL/min (ref >60)
Glucose, Bld: 121 mg/dL — ABNORMAL HIGH (ref 70–99)
Potassium: 3.8 mEq/L (ref 3.5–5.1)
Total Bilirubin: 0.3 mg/dL (ref 0.3–1.2)
Total Protein: 6.7 g/dL (ref 6.0–8.3)

## 2010-08-14 LAB — CBC
HCT: 38.5 % — ABNORMAL LOW (ref 39.0–52.0)
Hemoglobin: 13 g/dL (ref 13.0–17.0)
MCHC: 33.8 g/dL (ref 30.0–36.0)

## 2010-08-15 LAB — COMPREHENSIVE METABOLIC PANEL
ALT: 11 U/L (ref 0–53)
AST: 18 U/L (ref 0–37)
CO2: 30 mEq/L (ref 19–32)
Chloride: 103 mEq/L (ref 96–112)
GFR calc non Af Amer: >60 mL/min (ref >60)
Sodium: 138 mEq/L (ref 135–145)
Total Bilirubin: 0.3 mg/dL (ref 0.3–1.2)

## 2010-08-15 LAB — GLUCOSE, CAPILLARY

## 2010-08-15 LAB — CBC
Platelets: 130 10*3/uL — ABNORMAL LOW (ref 150–400)
RBC: 4.04 MIL/uL — ABNORMAL LOW (ref 4.22–5.81)
WBC: 5.5 10*3/uL (ref 4.0–10.5)

## 2010-08-15 LAB — DIFFERENTIAL
Basophils Absolute: 0 10*3/uL (ref 0.0–0.1)
Basophils Relative: 1 % (ref 0–1)
Eosinophils Absolute: 0.5 10*3/uL (ref 0.0–0.7)
Neutrophils Relative %: 44 % (ref 43–77)

## 2010-08-16 ENCOUNTER — Inpatient Hospital Stay (HOSPITAL_COMMUNITY): Payer: Medicare Other

## 2010-08-16 DIAGNOSIS — R5381 Other malaise: Secondary | ICD-10-CM

## 2010-08-16 DIAGNOSIS — R269 Unspecified abnormalities of gait and mobility: Secondary | ICD-10-CM

## 2010-08-16 DIAGNOSIS — I6330 Cerebral infarction due to thrombosis of unspecified cerebral artery: Secondary | ICD-10-CM

## 2010-08-16 HISTORY — PX: INSERT / REPLACE / REMOVE PACEMAKER: SUR710

## 2010-08-16 LAB — COMPREHENSIVE METABOLIC PANEL
ALT: 15 U/L (ref 0–53)
AST: 28 U/L (ref 0–37)
Alkaline Phosphatase: 79 U/L (ref 39–117)
CO2: 29 mEq/L (ref 19–32)
Calcium: 9.2 mg/dL (ref 8.4–10.5)
Chloride: 105 mEq/L (ref 96–112)
GFR calc Af Amer: >60 mL/min (ref >60)
GFR calc non Af Amer: >60 mL/min (ref >60)
Glucose, Bld: 114 mg/dL — ABNORMAL HIGH (ref 70–99)
Sodium: 140 mEq/L (ref 135–145)
Total Bilirubin: 0.3 mg/dL (ref 0.3–1.2)

## 2010-08-16 LAB — CBC
Hemoglobin: 13.8 g/dL (ref 13.0–17.0)
MCH: 31.9 pg (ref 26.0–34.0)
RBC: 4.33 MIL/uL (ref 4.22–5.81)
WBC: 6.6 10*3/uL (ref 4.0–10.5)

## 2010-08-16 LAB — DIFFERENTIAL
Basophils Absolute: 0 10*3/uL (ref 0.0–0.1)
Basophils Relative: 0 % (ref 0–1)
Lymphocytes Relative: 25 % (ref 12–46)
Monocytes Relative: 8 % (ref 3–12)
Neutro Abs: 3.7 10*3/uL (ref 1.7–7.7)
Neutrophils Relative %: 57 % (ref 43–77)

## 2010-08-16 LAB — GLUCOSE, CAPILLARY: Glucose-Capillary: 121 mg/dL — ABNORMAL HIGH (ref 70–99)

## 2010-08-17 LAB — DIFFERENTIAL
Basophils Absolute: 0 10*3/uL (ref 0.0–0.1)
Basophils Relative: 0 % (ref 0–1)
Eosinophils Absolute: 0.7 10*3/uL (ref 0.0–0.7)
Neutro Abs: 4.7 10*3/uL (ref 1.7–7.7)
Neutrophils Relative %: 60 % (ref 43–77)

## 2010-08-17 LAB — GLUCOSE, CAPILLARY: Glucose-Capillary: 121 mg/dL — ABNORMAL HIGH (ref 70–99)

## 2010-08-17 LAB — COMPREHENSIVE METABOLIC PANEL
Albumin: 3.2 g/dL — ABNORMAL LOW (ref 3.5–5.2)
BUN: 16 mg/dL (ref 6–23)
Chloride: 104 mEq/L (ref 96–112)
Creatinine, Ser: 0.88 mg/dL (ref 0.50–1.35)
Total Bilirubin: 0.4 mg/dL (ref 0.3–1.2)

## 2010-08-17 LAB — CBC
Hemoglobin: 13.6 g/dL (ref 13.0–17.0)
MCH: 32.2 pg (ref 26.0–34.0)
Platelets: 119 10*3/uL — ABNORMAL LOW (ref 150–400)
RBC: 4.22 MIL/uL (ref 4.22–5.81)

## 2010-08-23 NOTE — Discharge Summary (Signed)
[name], [name]               ACCOUNT NO.:  [id number]  MEDICAL RECORD NO.:  [id number]  LOCATION:  2002                         FACILITY:  [facility]  PHYSICIAN:  Kirk Baldwin A. [name], M.D.   DATE OF BIRTH:  11-21-1928  DATE OF ADMISSION:  08/11/2010 DATE OF DISCHARGE:  08/15/2010                              DISCHARGE SUMMARY   DISCHARGE DIAGNOSES: 1. Paroxysmal atrial fibrillation. 2. Severe sinus bradycardia with heart rates maintaining in the mid to     upper 30 range requiring permanent pacemaker placement this     admission. 3. Systolic chronic congestive heart failure, compensated currently. 4. Hypertension. 5. Significant left hemiparesis from remote stroke causing significant     gait instability. 6. Multiple falls recently. 7. Hypertension. 8. [name] is not a Coumadin or anticoagulation candidate due to his fall     risk and due to the fact that [name] has had bleeding problems in the     past.  Specifically, [name] had remote GI bleeding and last year [name] had     a significant spontaneous right upper extremity hematoma while on     blood thinner for a congestive heart failure admission. 9. Type 2 diabetes mellitus. 10.Obesity. 11.Benign prostatic hypertrophy. 12.History of asthma and/or chronic obstructive pulmonary disease.     However, breathing issues have not been a problem for him in the     last 2-3 years. 13.Chronic constipation. 14.Spinal stenosis. 15.Peripheral neuropathy. 16.Peripheral vascular disease. 17.Decreased hearing. 18.Posttraumatic stress disorder. 19.Osteoarthritis. 20.Left knee pain due to a recent fall on that knee and osteoarthritis     in that knee.  PROCEDURES:  Telemetry monitoring, Cardiology consultation, Electrophysiology consultation and placement of permanent pacemaker on August 15, 2010.  DISCHARGE MEDICATIONS: 1. Tylenol 650 mg every 6 hours as needed.  Do not use in the same 6-     hour period as the oxycodone/APAP. 2. Amlodipine 5 mg p.o.  daily. 3. Calcium 500 mg with vitamin D 200 units 1 tablet daily with food. 4. Furosemide 40 mg by mouth twice daily. 5. Vitamin D3 1000 units by mouth daily. 6. Niacin the ER 500 mg one by mouth each evening with a light snack     after 2 weeks increase to 1000 mg each evening with a light snack     and continue this long term. 7. Altace 10 mg twice daily. 8. Amiodarone 200 mg p.o. daily. 9. Aspirin 81 mg daily. 10.Cardura 2 mg daily. 11.Hydralazine 25 mg 3 times daily. 12.Oxycodone/APAP 5/325 1-2 tablets every 6 hours as needed for pain. 13.Potassium chloride 20 mEq daily. 14.Proscar 5 mg p.o. daily. 15.Pantoprazole 40 mg p.o. daily. 16.Trazodone 50 mg p.o. nightly. 17.WelChol 625 mg powder 1 packet daily with food with the largest     meal of the day. 18.Zetia 10 mg p.o. daily.  HISTORY OF PRESENT ILLNESS:  [name] is a pleasant 77 year old gentleman with multiple medical problems.  [name] was seen the day prior to admission in the office.  [name] was also seen in the emergency room about 1 week prior to admission.  [name] has had chronic gait instability from his left hemiparesis from a remote right brain stroke.  [name] is a chronic sinus bradycardia.  [name] has a history of paroxysmal atrial fibrillation, but [name] does much better when [name] is maintained in sinus rhythm.  In the office, we were setting up outpatient physical therapy.  However, [name] returned to the emergency room with significant bradycardia with heart rates in the 30s and increasing weakness and failure to thrive and is not doing well and with his gait instability just clearly not safe for continued home management.  Given these findings [name] was admitted for further care.  HOSPITAL COURSE:  [name] was admitted to a telemetry bed.  Cardiology was consulted as well as Electrophysiology.  It was felt that [name] would benefit from a permanent pacemaker placement.  This was performed on August 15, 2010.  [name] remained stable from  cardiovascular and pulmonary standpoint during his stay.  [name] had Physical Therapy and Occupational Therapy consults, they recommended skilled nursing placement for rehabilitation.  [name] was on the rehab service in the last year and they will be consulted prior to discharge.  By August 16, 2010, [name] was deemed stable for discharge to a rehab facility.  DISCHARGE PHYSICAL EXAMINATION:  VITAL SIGNS:  On August 15, 2010, temperature was 98.4, T-max 99.3, pulse 41, respiratory rate was 18, blood pressure 151/66, and 95% oxygen saturation on 2 L of oxygen. GENERAL:  [name] was semi-supine in no acute distress.  [name] is alert and oriented x4. LUNGS:  Clear to auscultation bilaterally and anterolaterally.  There was no wheezes, rales, or rhonchi. HEART:  Bradycardic with no murmur, rub, or gallop. ABDOMEN:  Soft, nontender with no mass or hepatosplenomegaly.  There was no peripheral edema.  [name] has left hemiparesis which is chronic.  DISCHARGE LABORATORY DATA:  White count 5.5 with a normal differential, hemoglobin 13.1, platelet count 130,000, sodium 138, potassium 3.9, chloride 103, CO2 of 30, BUN 16, creatinine 1.03, glucose 104.  LFTs normal.  The exception of an albumin of 3.0.  TSH was 2.357 on August 12, 2010, somatomedin C was 127 ng/mL which is normal.  This was sent as the admitting doctor felt [name] might have looked acromegalic, but this test is negative.  PT was 1.07, PTT was normal at 28 on August 12, 2010. Admission urinalysis was normal.  Chest x-ray on August 12, 2010, showed cardiomegaly with no acute disease.  DISCHARGE INSTRUCTIONS:  [name] is to follow a low-salt diabetic diet. [name] is to work with Physical Therapy and Occupational Therapy.  [name] is to follow with Dr. Waynard Knight 1 week after discharge from the rehab facility. [name] is to follow with Dr. Donnie Knight per his instructions and follow up with Electrophysiology per their instructions.  [name] is to have wound care per Cardiology  instruction.  [name] is a full code status.     [name] A. [name], M.D.     MAP/MEDQ  D:  08/15/2010  T:  08/15/2010  Job:  [id number]  Electronically Signed by Rodrigo Ran M.D. on 08/23/2010 03:49:06 PM

## 2010-08-29 ENCOUNTER — Ambulatory Visit (INDEPENDENT_AMBULATORY_CARE_PROVIDER_SITE_OTHER): Payer: Medicare Other | Admitting: *Deleted

## 2010-08-29 DIAGNOSIS — I495 Sick sinus syndrome: Secondary | ICD-10-CM

## 2010-08-29 LAB — PACEMAKER DEVICE OBSERVATION
AL IMPEDENCE PM: 572 Ohm
ATRIAL PACING PM: 80
BATTERY VOLTAGE: 2.8 V
VENTRICULAR PACING PM: 13
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
## 2010-09-02 ENCOUNTER — Emergency Department (HOSPITAL_COMMUNITY)
Admission: EM | Admit: 2010-09-02 | Discharge: 2010-09-02 | Disposition: A | Discharge status: Home / Self Care | Payer: Medicare Other | Attending: Emergency Medicine | Admitting: Emergency Medicine

## 2010-09-02 ENCOUNTER — Telehealth: Payer: Self-pay | Admitting: Internal Medicine

## 2010-09-02 ENCOUNTER — Encounter: Admitting: Internal Medicine

## 2010-09-02 DIAGNOSIS — Y831 Surgical operation with implant of artificial internal device as the cause of abnormal reaction of the patient, or of later complication, without mention of misadventure at the time of the procedure: Secondary | ICD-10-CM | POA: Insufficient documentation

## 2010-09-02 DIAGNOSIS — Z8673 Personal history of transient ischemic attack (TIA), and cerebral infarction without residual deficits: Secondary | ICD-10-CM | POA: Insufficient documentation

## 2010-09-02 DIAGNOSIS — I10 Essential (primary) hypertension: Secondary | ICD-10-CM | POA: Insufficient documentation

## 2010-09-02 DIAGNOSIS — T81329A Deep disruption or dehiscence of operation wound, unspecified, initial encounter: Secondary | ICD-10-CM | POA: Insufficient documentation

## 2010-09-02 DIAGNOSIS — T8132XA Disruption of internal operation (surgical) wound, not elsewhere classified, initial encounter: Secondary | ICD-10-CM | POA: Insufficient documentation

## 2010-09-02 DIAGNOSIS — Z79899 Other long term (current) drug therapy: Secondary | ICD-10-CM | POA: Insufficient documentation

## 2010-09-02 DIAGNOSIS — E119 Type 2 diabetes mellitus without complications: Secondary | ICD-10-CM | POA: Insufficient documentation

## 2010-09-02 NOTE — Telephone Encounter (Signed)
Blood pressure 180/100 pt has not had his blood pressure meds this morning. Pt area site is raise and hot to touch. Terri would like to talk to someone re pt device.

## 2010-09-02 NOTE — Telephone Encounter (Signed)
Per Asencion Islam @ [facility] patient's pacer site red and swollen and night shirt was "blood soaked."  Advised to send patient to [facility] ED per Gypsy Balsam, RN
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
## 2010-09-05 ENCOUNTER — Ambulatory Visit: Admitting: *Deleted
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
## 2010-09-20 ENCOUNTER — Encounter: Payer: Medicare Other | Admitting: *Deleted

## 2010-09-26 ENCOUNTER — Ambulatory Visit (INDEPENDENT_AMBULATORY_CARE_PROVIDER_SITE_OTHER): Payer: Medicare Other | Admitting: *Deleted

## 2010-09-26 DIAGNOSIS — I495 Sick sinus syndrome: Secondary | ICD-10-CM

## 2010-09-26 NOTE — Progress Notes (Signed)
Pacer checked in device clinic. 

## 2010-09-26 NOTE — Progress Notes (Signed)
Wound check pacer 

## 2010-11-13 ENCOUNTER — Emergency Department (HOSPITAL_COMMUNITY): Payer: Medicare Other

## 2010-11-13 ENCOUNTER — Inpatient Hospital Stay (HOSPITAL_COMMUNITY)
Admission: EM | Admit: 2010-11-13 | Discharge: 2010-11-21 | DRG: 167 | Disposition: A | Discharge status: Skilled Nursing Facility | Payer: Medicare Other | Attending: Cardiology | Admitting: Cardiology

## 2010-11-13 DIAGNOSIS — D500 Iron deficiency anemia secondary to blood loss (chronic): Secondary | ICD-10-CM | POA: Diagnosis present

## 2010-11-13 DIAGNOSIS — K5900 Constipation, unspecified: Secondary | ICD-10-CM | POA: Diagnosis present

## 2010-11-13 DIAGNOSIS — M1990 Unspecified osteoarthritis, unspecified site: Secondary | ICD-10-CM | POA: Diagnosis present

## 2010-11-13 DIAGNOSIS — R0902 Hypoxemia: Secondary | ICD-10-CM | POA: Diagnosis present

## 2010-11-13 DIAGNOSIS — G609 Hereditary and idiopathic neuropathy, unspecified: Secondary | ICD-10-CM | POA: Diagnosis present

## 2010-11-13 DIAGNOSIS — J4489 Other specified chronic obstructive pulmonary disease: Secondary | ICD-10-CM | POA: Diagnosis present

## 2010-11-13 DIAGNOSIS — I509 Heart failure, unspecified: Secondary | ICD-10-CM | POA: Diagnosis present

## 2010-11-13 DIAGNOSIS — R079 Chest pain, unspecified: Secondary | ICD-10-CM

## 2010-11-13 DIAGNOSIS — I69959 Hemiplegia and hemiparesis following unspecified cerebrovascular disease affecting unspecified side: Secondary | ICD-10-CM

## 2010-11-13 DIAGNOSIS — J449 Chronic obstructive pulmonary disease, unspecified: Secondary | ICD-10-CM | POA: Diagnosis present

## 2010-11-13 DIAGNOSIS — F4310 Post-traumatic stress disorder, unspecified: Secondary | ICD-10-CM | POA: Diagnosis present

## 2010-11-13 DIAGNOSIS — I2699 Other pulmonary embolism without acute cor pulmonale: Principal | ICD-10-CM | POA: Diagnosis present

## 2010-11-13 DIAGNOSIS — E785 Hyperlipidemia, unspecified: Secondary | ICD-10-CM | POA: Diagnosis present

## 2010-11-13 DIAGNOSIS — Z950 Presence of cardiac pacemaker: Secondary | ICD-10-CM

## 2010-11-13 DIAGNOSIS — I110 Hypertensive heart disease with heart failure: Secondary | ICD-10-CM | POA: Diagnosis present

## 2010-11-13 DIAGNOSIS — E119 Type 2 diabetes mellitus without complications: Secondary | ICD-10-CM | POA: Diagnosis present

## 2010-11-13 DIAGNOSIS — I2589 Other forms of chronic ischemic heart disease: Secondary | ICD-10-CM | POA: Diagnosis present

## 2010-11-13 DIAGNOSIS — I5022 Chronic systolic (congestive) heart failure: Secondary | ICD-10-CM | POA: Diagnosis present

## 2010-11-13 DIAGNOSIS — R7989 Other specified abnormal findings of blood chemistry: Secondary | ICD-10-CM

## 2010-11-13 DIAGNOSIS — Z87891 Personal history of nicotine dependence: Secondary | ICD-10-CM

## 2010-11-13 DIAGNOSIS — N400 Enlarged prostate without lower urinary tract symptoms: Secondary | ICD-10-CM | POA: Diagnosis present

## 2010-11-13 DIAGNOSIS — E669 Obesity, unspecified: Secondary | ICD-10-CM | POA: Diagnosis present

## 2010-11-13 DIAGNOSIS — I2510 Atherosclerotic heart disease of native coronary artery without angina pectoris: Secondary | ICD-10-CM | POA: Diagnosis present

## 2010-11-13 DIAGNOSIS — M7981 Nontraumatic hematoma of soft tissue: Secondary | ICD-10-CM | POA: Diagnosis present

## 2010-11-13 LAB — CK TOTAL AND CKMB (NOT AT ARMC)
CK, MB: 2.5 ng/mL (ref 0.3–4.0)
CK, MB: 2.4 ng/mL (ref 0.3–4.0)
Relative Index: 2.4 (ref 0.0–2.5)
Total CK: 103 U/L (ref 7–232)

## 2010-11-13 LAB — DIFFERENTIAL
Basophils Relative: 0 % (ref 0–1)
Eosinophils Absolute: 0.3 10*3/uL (ref 0.0–0.7)
Eosinophils Relative: 6 % — ABNORMAL HIGH (ref 0–5)
Neutrophils Relative %: 43 % (ref 43–77)

## 2010-11-13 LAB — CBC
MCH: 32.5 pg (ref 26.0–34.0)
Platelets: 110 10*3/uL — ABNORMAL LOW (ref 150–400)
RBC: 4.24 MIL/uL (ref 4.22–5.81)
RDW: 14.6 % (ref 11.5–15.5)
WBC: 4.5 10*3/uL (ref 4.0–10.5)

## 2010-11-13 LAB — URINALYSIS, ROUTINE W REFLEX MICROSCOPIC
Glucose, UA: NEGATIVE mg/dL
Hgb urine dipstick: NEGATIVE
Ketones, ur: 15 mg/dL — AB
Protein, ur: NEGATIVE mg/dL

## 2010-11-13 LAB — PROTIME-INR: INR: 1.05 (ref 0.00–1.49)

## 2010-11-13 LAB — CARDIAC PANEL(CRET KIN+CKTOT+MB+TROPI): Troponin I: <0.3 ng/mL (ref <0.30)

## 2010-11-13 LAB — APTT: aPTT: 28 seconds (ref 24–37)

## 2010-11-13 LAB — BASIC METABOLIC PANEL
BUN: 14 mg/dL (ref 6–23)
Calcium: 9.2 mg/dL (ref 8.4–10.5)
GFR calc Af Amer: >60 mL/min (ref >60)
GFR calc non Af Amer: >60 mL/min (ref >60)
Potassium: 3.9 mEq/L (ref 3.5–5.1)

## 2010-11-13 LAB — POCT I-STAT TROPONIN I: Troponin i, poc: 0.01 ng/mL (ref 0.00–0.08)

## 2010-11-13 LAB — GLUCOSE, CAPILLARY: Glucose-Capillary: 131 mg/dL — ABNORMAL HIGH (ref 70–99)

## 2010-11-14 ENCOUNTER — Inpatient Hospital Stay (HOSPITAL_COMMUNITY): Payer: Medicare Other

## 2010-11-14 DIAGNOSIS — R0602 Shortness of breath: Secondary | ICD-10-CM

## 2010-11-14 LAB — GLUCOSE, CAPILLARY: Glucose-Capillary: 130 mg/dL — ABNORMAL HIGH (ref 70–99)

## 2010-11-14 LAB — CARDIAC PANEL(CRET KIN+CKTOT+MB+TROPI)
Relative Index: INVALID (ref 0.0–2.5)
Total CK: 93 U/L (ref 7–232)
Troponin I: <0.3 ng/mL (ref <0.30)

## 2010-11-14 LAB — TSH: TSH: 1.541 u[IU]/mL (ref 0.350–4.500)

## 2010-11-14 MED ORDER — XENON XE 133 GAS
10.0000 | GAS_FOR_INHALATION | Freq: Once | RESPIRATORY_TRACT | Status: AC | PRN
  Administered 2010-11-14: 10 via RESPIRATORY_TRACT

## 2010-11-14 MED ORDER — TECHNETIUM TO 99M ALBUMIN AGGREGATED
6.0000 | Freq: Once | INTRAVENOUS | Status: AC | PRN
  Administered 2010-11-14: 6 via INTRAVENOUS

## 2010-11-15 LAB — CBC
Hemoglobin: 13.8 g/dL (ref 13.0–17.0)
MCH: 31.9 pg (ref 26.0–34.0)
MCHC: 34.2 g/dL (ref 30.0–36.0)
MCV: 93.3 fL (ref 78.0–100.0)

## 2010-11-15 LAB — HEPARIN LEVEL (UNFRACTIONATED): Heparin Unfractionated: 1 IU/mL — ABNORMAL HIGH (ref 0.30–0.70)

## 2010-11-15 LAB — BASIC METABOLIC PANEL
CO2: 27 mEq/L (ref 19–32)
Calcium: 9.2 mg/dL (ref 8.4–10.5)
Chloride: 107 mEq/L (ref 96–112)
Glucose, Bld: 149 mg/dL — ABNORMAL HIGH (ref 70–99)
Sodium: 142 mEq/L (ref 135–145)

## 2010-11-15 LAB — GLUCOSE, CAPILLARY
Glucose-Capillary: 107 mg/dL — ABNORMAL HIGH (ref 70–99)
Glucose-Capillary: 135 mg/dL — ABNORMAL HIGH (ref 70–99)
Glucose-Capillary: 103 mg/dL — ABNORMAL HIGH (ref 70–99)

## 2010-11-16 ENCOUNTER — Encounter: Payer: Self-pay | Admitting: Internal Medicine

## 2010-11-16 LAB — CBC
Hemoglobin: 12.9 g/dL — ABNORMAL LOW (ref 13.0–17.0)
MCH: 31.8 pg (ref 26.0–34.0)
Platelets: 114 10*3/uL — ABNORMAL LOW (ref 150–400)
RBC: 4.06 MIL/uL — ABNORMAL LOW (ref 4.22–5.81)
WBC: 6 10*3/uL (ref 4.0–10.5)

## 2010-11-16 LAB — HEPARIN LEVEL (UNFRACTIONATED): Heparin Unfractionated: 0.63 IU/mL (ref 0.30–0.70)

## 2010-11-16 LAB — GLUCOSE, CAPILLARY
Glucose-Capillary: 116 mg/dL — ABNORMAL HIGH (ref 70–99)
Glucose-Capillary: 93 mg/dL (ref 70–99)

## 2010-11-16 LAB — PROTIME-INR
INR: 0.99 (ref 0.00–1.49)
Prothrombin Time: 13.3 seconds (ref 11.6–15.2)

## 2010-11-17 ENCOUNTER — Encounter: Payer: Medicare Other | Admitting: Internal Medicine

## 2010-11-17 LAB — BASIC METABOLIC PANEL
CO2: 29 mEq/L (ref 19–32)
Calcium: 9.5 mg/dL (ref 8.4–10.5)
GFR calc non Af Amer: >60 mL/min (ref >60)
Potassium: 3.7 mEq/L (ref 3.5–5.1)
Sodium: 141 mEq/L (ref 135–145)

## 2010-11-17 LAB — HEPARIN LEVEL (UNFRACTIONATED): Heparin Unfractionated: 0.58 IU/mL (ref 0.30–0.70)

## 2010-11-17 LAB — CBC
Hemoglobin: 12.9 g/dL — ABNORMAL LOW (ref 13.0–17.0)
MCH: 31.5 pg (ref 26.0–34.0)
MCV: 93.7 fL (ref 78.0–100.0)
Platelets: 127 10*3/uL — ABNORMAL LOW (ref 150–400)
RBC: 4.1 MIL/uL — ABNORMAL LOW (ref 4.22–5.81)
WBC: 6.3 10*3/uL (ref 4.0–10.5)

## 2010-11-17 LAB — GLUCOSE, CAPILLARY: Glucose-Capillary: 112 mg/dL — ABNORMAL HIGH (ref 70–99)

## 2010-11-17 LAB — PROTIME-INR: INR: 1.07 (ref 0.00–1.49)

## 2010-11-18 DIAGNOSIS — M79609 Pain in unspecified limb: Secondary | ICD-10-CM

## 2010-11-18 DIAGNOSIS — I2699 Other pulmonary embolism without acute cor pulmonale: Secondary | ICD-10-CM

## 2010-11-18 DIAGNOSIS — M7989 Other specified soft tissue disorders: Secondary | ICD-10-CM

## 2010-11-18 LAB — GLUCOSE, CAPILLARY: Glucose-Capillary: 108 mg/dL — ABNORMAL HIGH (ref 70–99)

## 2010-11-18 LAB — CBC
HCT: 33.3 % — ABNORMAL LOW (ref 39.0–52.0)
Hemoglobin: 11.5 g/dL — ABNORMAL LOW (ref 13.0–17.0)
MCH: 31.9 pg (ref 26.0–34.0)
MCHC: 34.5 g/dL (ref 30.0–36.0)
MCV: 92.2 fL (ref 78.0–100.0)
WBC: 7.1 10*3/uL (ref 4.0–10.5)

## 2010-11-19 ENCOUNTER — Inpatient Hospital Stay (HOSPITAL_COMMUNITY): Payer: Medicare Other

## 2010-11-19 LAB — BASIC METABOLIC PANEL
BUN: 14 mg/dL (ref 6–23)
Calcium: 8.7 mg/dL (ref 8.4–10.5)
Creatinine, Ser: 0.82 mg/dL (ref 0.50–1.35)
GFR calc non Af Amer: >60 mL/min (ref >60)
Glucose, Bld: 106 mg/dL — ABNORMAL HIGH (ref 70–99)
Sodium: 141 mEq/L (ref 135–145)

## 2010-11-19 LAB — CBC
MCV: 92.7 fL (ref 78.0–100.0)
Platelets: 109 10*3/uL — ABNORMAL LOW (ref 150–400)
RDW: 14.8 % (ref 11.5–15.5)
WBC: 6.4 10*3/uL (ref 4.0–10.5)

## 2010-11-19 LAB — GLUCOSE, CAPILLARY: Glucose-Capillary: 137 mg/dL — ABNORMAL HIGH (ref 70–99)

## 2010-11-19 MED ORDER — IOHEXOL 300 MG/ML  SOLN
100.0000 mL | Freq: Once | INTRAMUSCULAR | Status: AC | PRN
  Administered 2010-11-19: 40 mL via INTRAVENOUS

## 2010-11-20 LAB — GLUCOSE, CAPILLARY
Glucose-Capillary: 127 mg/dL — ABNORMAL HIGH (ref 70–99)
Glucose-Capillary: 111 mg/dL — ABNORMAL HIGH (ref 70–99)
Glucose-Capillary: 140 mg/dL — ABNORMAL HIGH (ref 70–99)

## 2010-11-20 LAB — PROTIME-INR
INR: 1.23 (ref 0.00–1.49)
Prothrombin Time: 15.8 s — ABNORMAL HIGH (ref 11.6–15.2)

## 2010-11-20 LAB — CBC
HCT: 33.5 % — ABNORMAL LOW (ref 39.0–52.0)
Hemoglobin: 11.2 g/dL — ABNORMAL LOW (ref 13.0–17.0)
MCH: 31.5 pg (ref 26.0–34.0)
MCHC: 33.4 g/dL (ref 30.0–36.0)
MCV: 94.1 fL (ref 78.0–100.0)
Platelets: 109 K/uL — ABNORMAL LOW (ref 150–400)
RBC: 3.56 MIL/uL — ABNORMAL LOW (ref 4.22–5.81)
RDW: 14.9 % (ref 11.5–15.5)
WBC: 7.3 K/uL (ref 4.0–10.5)

## 2010-11-21 LAB — GLUCOSE, CAPILLARY: Glucose-Capillary: 117 mg/dL — ABNORMAL HIGH (ref 70–99)

## 2010-11-21 LAB — CBC
Platelets: 127 10*3/uL — ABNORMAL LOW (ref 150–400)
RBC: 3.35 MIL/uL — ABNORMAL LOW (ref 4.22–5.81)
RDW: 15.1 % (ref 11.5–15.5)
WBC: 6.4 10*3/uL (ref 4.0–10.5)

## 2010-11-25 ENCOUNTER — Encounter: Payer: Self-pay | Admitting: Internal Medicine

## 2010-11-29 ENCOUNTER — Encounter: Payer: Medicare Other | Admitting: Internal Medicine

## 2010-11-30 NOTE — H&P (Signed)
NAMEANTONIOUS, [name]               ACCOUNT NO.:  [id number]  MEDICAL RECORD NO.:  [id number]  LOCATION:  2027                         FACILITY:  [facility]  PHYSICIAN:  Gerrit Friends. Dietrich Pates, MD, FACCDATE OF BIRTH:  June 14, 1928  DATE OF ADMISSION:  11/13/2010 DATE OF DISCHARGE:                             HISTORY & PHYSICAL   PRIMARY CARDIOLOGIST:  Georga Hacking, MD  ELECTROPHYSIOLOGIST:  Doylene Canning. Ladona Ridgel, MD  PRIMARY CARE PROVIDER:  Redge Gainer. Perini, MD  The patient is being admitted to Dr. York Spaniel service.  PATIENT PROFILE:  This is a 77 year old African American male with history tachybrady syndrome, status post recent pacemaker as well as history of CVA with chronic left hemiparesis who presents today with acute on chronic right shoulder pain and marked elevation of his D- dimer.  PROBLEM LIST: 1. Right shoulder pain, acute on chronic. 2. Hypertension. 3. Hyperlipidemia. 4. History of cerebrovascular accident in 1997 with chronic left     hemiparesis and gait instability. 5. History of atrial fibrillation/flutter/tachybrady syndrome and     symptomatic bradycardia.     a.     Status post Medtronic Sensia dual-chamber permanent      pacemaker on August 16, 2010.     b.     The patient is not felt to be a Coumadin candidate secondary      to history of gait instability as well as history of      gastrointestinal bleeding. 6. History of falls. 7. Obesity. 8. Benign prostatic hypertrophy. 9. Asthma/chronic obstructive pulmonary disease. 10.Chronic constipation. 11.Spinal stenosis. 12.Peripheral neuropathy. 13.Hard-of-hearing. 14.Posttraumatic stress disorder. 15.Osteoarthritis. 16.History of gastrointestinal bleed. 17.Diet-controlled diabetes mellitus. 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     a.     On October 12, 2009, 2-D echocardiogram, ejection fraction of      20-25%, diffuse hypokinesis, mild mitral regurgitation, mildly      dilated left atrium,  moderately depressed right ventricular      function, and pulmonary artery systolic pressure of 50 mmHg.  ALLERGIES: 1. ZOCOR. 2. BETA-BLOCKER. 3. DIGOXIN.  HISTORY OF PRESENT ILLNESS:  This is a 77 year old African American male with history of AFib/flutter/tachybrady syndrome, status post Medtronic dual-chamber permanent pacemaker in June 2012.  Secondary to remote CVA with ongoing left hemiparesis, the patient is quite sedentary in activity and generally includes" watching TV and playing video games." The patient sleeps on the right side nightly secondary to his left hemiparesis and awakes every morning with right shoulder pain for which he takes two Tylenol with eventual relief.  This has been a longstanding issue.  This morning, the patient awoke with his usual right shoulder pain that was worse with movement of the right arm/shoulder and not immediately relieved with Tylenol.  The patient was concerned that pain may be related to pacemaker (which is in his left chest) and his wife [id number].  Upon EMS arrival, the patient was treated with sublingual nitroglycerin with eventual easing off of his shoulder pain.  He is currently pain free.  In the ER, his enzymes are negative and ECG is nonacute (atrial paced), however, D-dimer is markedly elevated at 13.26. The patient denies any dyspnea.  He is not tachycardic.  He further denies any recent lower extremity pain or swelling.Marland [name]  HOME MEDICATIONS: 1. Norvasc 5 mg daily. 2. Calcium plus D 500/200 mg daily. 3. Lasix 40 mg b.i.d. 4. Vitamin D3 1000 units daily. 5. Niacin ER 1000 mg daily. 6. Altace 10 mg b.i.d. 7. Amiodarone 200 mg daily. 8. Aspirin 81 mg daily. 9. Cardura 2 mg daily. 10.Hydralazine 25 mg t.i.d. 11.Oxycodone/APAP 5/325 mg 1-2 tablets q.6 h. p.r.n. 12.Potassium chloride 20 mEq daily. 13.Proscar 5 mg daily. 14.Protonix 10 mg daily. 15.Trazodone 50 mg q.p.m. 16.WelChol 625 mg daily. 17.Zetia 10 mg  daily.  FAMILY HISTORY:  Mother died in her 38s with history of diabetes. Father died in his 74s of an MI.  SOCIAL HISTORY:  The patient lives in [place] with his wife.  He was in the [employer] for 21 years, subsequently retired and then worked for 20 years in the [employer].  He is now retired from all work.  He has a 50-pack-year history of tobacco abuse quitting in 1996.  Denies alcohol or drug use and ambulates at home with use of a walker and sometimes uses a wheelchair follow.  REVIEW OF SYSTEMS:  Positive for urinary frequency daily, right shoulder pain which seems musculoskeletal nature.  He has chronic constipation. He is a full code.  Otherwise, all systems reviewed are negative.  PHYSICAL EXAM:  VITAL SIGNS:  Temperature 98.4, heart rate 65, respirations 14, blood pressure 141/75, and pulse ox 100% on 2 liters. GENERAL:  A pleasant African American male, in no acute distress. Awake, alert, oriented x3.  He has a normal affect. HEENT:  Normal. NEURO:  Notable for left hemiparesis. SKIN:  Warm without lesions or masses. NECK:  Supple without bruits or JVD. LUNGS:  Respirations are regular and unlabored.  Diminished breath sound bilaterally. CARDIAC:  Regular, but distant, S1 and S2.  No S3, S4, or murmurs. ABDOMEN:  Round, soft, nontender, and nondistended.  Bowel sounds present x4. EXTREMITIES:  Warm and dry.  No clubbing or cyanosis.  Dorsalis pedis and posterior pulses are 1+ and equal bilaterally.  No lower extremity edema.  Chest x-ray shows cardiomegaly.  EKG shows atrial paced rhythm at 61 beats per minute, no acute ST-T changes.  Hemoglobin 13.8, hematocrit 39.3, WBC 4.5, and platelets 110.  Sodium 140, potassium 3.9, chloride 104, CO2 of 28, BUN 14, creatinine 0.96, and glucose 98.  D-dimer 13.26. cardiac enzymes negative x2.  ASSESSMENT/PLAN: 1. Right shoulder pain.  The patient has chronic positional right     shoulder pain related to sleeping on it at  night and this morning     his pain was worsened unrelieved by Tylenol.  The patient says pain     was worse with movement and palpation this morning, but has since     resolved after taking some Tylenol and subsequently a nitroglycerin     provided by EMS.  His ECG is nonacute and his point-of-care markers     negative.  Doubt ischemia, however, his D-dimer is markedly     elevated at 13.26.  Secondary to poor IV access, a V/Q scan has     been ordered.  However, it is likely to be performed this evening,     therefore we will plan to observe tonight and we will continue a     plan for a V/Q scan and we will also add a bilateral lower     extremity ultrasound to rule out DVT.  We will add DVT dose  Lovenox     for now.  If the patient rules in for DVT/PE, we will likely need     to consider IVC filter given poor Coumadin candidacy in the past     related to GI bleed and falls. 2. Nonischemic cardiomyopathy, previously felt to be related to     tachycardia mediation.  The patient is followed by Dr. Donnie Aho in     the outpatient setting.  He has prior intolerance to BETA-BLOCKERS     (reported) given that he is status post pacemaker.  Continue ACE     inhibitor.  Volume status looks good. 3. History of tachybrady syndrome/atrial fibrillation/flutter, atrial     paced.  Continue amiodarone.  Continue aspirin. 4. Hypertension.  Resume home medications and follow up in the a.m. 5. Diet-controlled diabetes mellitus.  Add sliding scale insulin while     hospitalized and follow. 6. Hyperlipidemia.  He is intolerant to STATINS.  Continue his home     regimen of WelChol and Zetia as well as niacin.     Nicolasa Ducking, ANP   ______________________________ Gerrit Friends. Dietrich Pates, MD, [facility]    CB/MEDQ  D:  11/13/2010  T:  11/14/2010  Job:  [id number]  Electronically Signed by Nicolasa Ducking ANP on 11/17/2010 04:01:27 PM Electronically Signed by [place] Bing MD FACC on 11/30/2010 10:32:06  AM

## 2010-11-30 NOTE — Discharge Summary (Signed)
NAMECRUZE, ZINGARO               ACCOUNT NO.:  [id number]  MEDICAL RECORD NO.:  [id number]  LOCATION:  2027                         FACILITY:  [facility]  PHYSICIAN:  Georga Hacking, M.D.DATE OF BIRTH:  05-04-1928  DATE OF ADMISSION:  11/13/2010 DATE OF DISCHARGE:  11/21/2010                              DISCHARGE SUMMARY   FINAL DIAGNOSES: 1. Acute pulmonary embolism. 2. Hypertensive heart disease. 3. History of atrial fibrillation, currently in sinus rhythm. 4. Sick sinus syndrome. 5. Previous old cerebrovascular accident with chronic left hemiparesis     and gait instability. 6. Hyperlipidemia. 7. Benign prostatic hypertrophy. 8. Previous chronic obstructive pulmonary disease. 9. Spinal stenosis. 10.History of gastrointestinal bleed. 11.Blood loss anemia due to hemorrhage in the right upper biceps area. 12.Diet-controlled diabetes.  PROCEDURES:  Implantation of a permanent inferior vena cava filter, echocardiogram.  HISTORY:  This 77 year old black male is brought into the hospital for treatment of shoulder pain.  He has a complicated history with tachycardia bradycardia syndrome, atrial fibrillation, had a pacemaker implanted earlier this year on his left side.  He presented with right shoulder pain to the emergency room.  He has a history of significant falls.  His history is also notable for a prior history of GI bleeding requiring cessation of Coumadin as well as spontaneous hemorrhages in the past.  He came to the emergency room with atypical shoulder pain and was seen initially by the physician on-call and was found to have a D- dimer elevated at 13.26.  He was not tachycardic and denied dyspnea.  He did not have lower extremity pain or swelling and was admitted for further evaluation.  Please see the previously dictated history and physical for remainder of the details.  HOSPITAL COURSE:  Laboratory data on admission showed a hemoglobin of 13.8, hematocrit of  39.3, D-dimer was 13.26.  Chemistry panel was normal.  Two troponins were normal.  TSH was 1.541.  Urinalysis was unremarkable.  The patient was admitted to the hospital.  He was unable to have a CT scan done the first day because of lack of IV access.  He was feeling better the next morning and had a negative lower extremity venous Doppler study.  A 2-D echocardiogram showed interval improvement of his LV function with an estimated ejection fraction of 50%.  The pain in the chest had improved and he had a ventilation-perfusion lung scan that was high probability for pulmonary embolus.  In light of this and in light of his prior history, it was uncertain whether the DVT could come from the leg or come from the left arm in light of his previous pacemaker placement.  There was no clinical evidence of thrombosis of the left arm.  Upper extremity Doppler later done later on did not show any right extremity DVT.  The patient was placed on heparin and we decided that we would try to treat him with Coumadin again.  He was placed on heparin and later Coumadin and his aspirin was stopped.  He, however, developed a biceps hemorrhage in his right upper arm on the 21st where he had significant swelling and tenderness and that is when we did the upper extremity  Doppler showing no evidence of DVT.  He continued to have significant pain and swelling in this arm and we felt we would have to stop anticoagulation.  In light of this and his previous difficulties with systemic anticoagulation, it was opted to place inferior vena cava filter.  This was placed by Interventional Radiology on November 19, 2010, with a permanent IVC filter placed without complications.  He was treated with warm compresses to his upper arm, but was clinically improved, and was able to be discharged on Monday.  He is discharged at this time in improved condition on, 1. Altace 10 mg b.i.d. 2. Amiodarone 200 mg daily. 3.  Amlodipine 5 mg daily. 4. Aspirin 81 mg daily. 5. Calcium 1 tablet daily. 6. Cardura 2 mg daily. 7. Finasteride 5 mg daily. 8. Furosemide 40 mg b.i.d. 9. Hydralazine 25 t.i.d. 10.Niacin extended release 500 mg at bedtime. 11.Potassium 20 mEq daily. 12.Protonix 40 mg daily. 13.Trazodone 50 mg daily. 14.Tylenol as needed. 15.Vitamin D3 by mouth daily. 16.WelChol Powder 625 mg daily. 17.Zetia 10 mg daily.  He is to follow up with Dr. Waynard Edwards in 1 week.  He is to follow up with me in 2-3 weeks.  He is to have a home health care RN.  He is also to have home health physical therapy.  He is also to have disease management.     Georga Hacking, M.D.     WST/MEDQ  D:  11/21/2010  T:  11/21/2010  Job:  [id number]  cc:   Loraine Leriche A. Perini, M.D.  Electronically Signed by Lacretia Nicks. Donnie Aho M.D. on 11/30/2010 12:37:28 PM

## 2011-02-13 ENCOUNTER — Other Ambulatory Visit: Payer: Self-pay

## 2011-02-13 ENCOUNTER — Encounter (HOSPITAL_COMMUNITY): Payer: Self-pay | Admitting: *Deleted

## 2011-02-13 ENCOUNTER — Emergency Department (HOSPITAL_COMMUNITY)
Admission: EM | Admit: 2011-02-13 | Discharge: 2011-02-13 | Disposition: A | Discharge status: Home / Self Care | Payer: Medicare Other | Attending: Emergency Medicine | Admitting: Emergency Medicine

## 2011-02-13 ENCOUNTER — Emergency Department (HOSPITAL_COMMUNITY): Payer: Medicare Other

## 2011-02-13 DIAGNOSIS — G8929 Other chronic pain: Secondary | ICD-10-CM | POA: Insufficient documentation

## 2011-02-13 DIAGNOSIS — J449 Chronic obstructive pulmonary disease, unspecified: Secondary | ICD-10-CM | POA: Insufficient documentation

## 2011-02-13 DIAGNOSIS — E785 Hyperlipidemia, unspecified: Secondary | ICD-10-CM | POA: Insufficient documentation

## 2011-02-13 DIAGNOSIS — J4489 Other specified chronic obstructive pulmonary disease: Secondary | ICD-10-CM | POA: Insufficient documentation

## 2011-02-13 DIAGNOSIS — M25519 Pain in unspecified shoulder: Secondary | ICD-10-CM | POA: Insufficient documentation

## 2011-02-13 DIAGNOSIS — R079 Chest pain, unspecified: Secondary | ICD-10-CM | POA: Insufficient documentation

## 2011-02-13 DIAGNOSIS — I10 Essential (primary) hypertension: Secondary | ICD-10-CM | POA: Insufficient documentation

## 2011-02-13 DIAGNOSIS — Z79899 Other long term (current) drug therapy: Secondary | ICD-10-CM | POA: Insufficient documentation

## 2011-02-13 DIAGNOSIS — E119 Type 2 diabetes mellitus without complications: Secondary | ICD-10-CM | POA: Insufficient documentation

## 2011-02-13 LAB — CBC
MCH: 31.8 pg (ref 26.0–34.0)
MCHC: 33.6 g/dL (ref 30.0–36.0)
MCV: 94.4 fL (ref 78.0–100.0)
Platelets: 124 10*3/uL — ABNORMAL LOW (ref 150–400)
RBC: 4.5 MIL/uL (ref 4.22–5.81)

## 2011-02-13 LAB — DIFFERENTIAL
Basophils Relative: 0 % (ref 0–1)
Eosinophils Absolute: 0.2 10*3/uL (ref 0.0–0.7)
Eosinophils Relative: 4 % (ref 0–5)
Lymphs Abs: 1.9 10*3/uL (ref 0.7–4.0)
Neutrophils Relative %: 43 % (ref 43–77)

## 2011-02-13 LAB — BASIC METABOLIC PANEL
BUN: 10 mg/dL (ref 6–23)
Calcium: 10.1 mg/dL (ref 8.4–10.5)
GFR calc Af Amer: 87 mL/min — ABNORMAL LOW (ref >90)
GFR calc non Af Amer: 75 mL/min — ABNORMAL LOW (ref >90)
Glucose, Bld: 91 mg/dL (ref 70–99)
Potassium: 4 mEq/L (ref 3.5–5.1)
Sodium: 140 mEq/L (ref 135–145)

## 2011-02-13 LAB — CARDIAC PANEL(CRET KIN+CKTOT+MB+TROPI)
Relative Index: 2.1 (ref 0.0–2.5)
Troponin I: <0.3 ng/mL (ref <0.30)

## 2011-02-13 LAB — PROTIME-INR: Prothrombin Time: 13.3 seconds (ref 11.6–15.2)

## 2011-02-13 LAB — POCT I-STAT TROPONIN I

## 2011-02-13 MED ORDER — IOHEXOL 300 MG/ML  SOLN
100.0000 mL | Freq: Once | INTRAMUSCULAR | Status: AC | PRN
  Administered 2011-02-13: 100 mL via INTRAVENOUS

## 2011-02-13 MED ORDER — SODIUM CHLORIDE 0.9 % IV SOLN
Freq: Once | INTRAVENOUS | Status: AC
  Administered 2011-02-13: 125 mL via INTRAVENOUS

## 2011-02-13 MED ORDER — FENTANYL CITRATE 0.05 MG/ML IJ SOLN
50.0000 ug | Freq: Once | INTRAMUSCULAR | Status: AC
  Administered 2011-02-13: 50 ug via INTRAVENOUS
  Filled 2011-02-13: qty 2

## 2011-02-13 MED ORDER — ACETAMINOPHEN 325 MG PO TABS
975.0000 mg | ORAL_TABLET | Freq: Once | ORAL | Status: AC
  Administered 2011-02-13: 975 mg via ORAL
  Filled 2011-02-13: qty 3

## 2011-02-13 NOTE — Discharge Instructions (Signed)
Chest Pain (Nonspecific) It is often hard to give a specific diagnosis for the cause of chest pain. There is always a chance that your pain could be related to something serious, such as a heart attack or a blood clot in the lungs. You need to follow up with your caregiver for further evaluation. CAUSES   Heartburn.   Pneumonia or bronchitis.   Anxiety and stress.   Inflammation around your heart (pericarditis) or lung (pleuritis or pleurisy).   A blood clot in the lung.   A collapsed lung (pneumothorax). It can develop suddenly on its own (spontaneous pneumothorax) or from injury (trauma) to the chest.  The chest wall is composed of bones, muscles, and cartilage. Any of these can be the source of the pain.  The bones can be bruised by injury.   The muscles or cartilage can be strained by coughing or overwork.   The cartilage can be affected by inflammation and become sore (costochondritis).  DIAGNOSIS  Lab tests or other studies, such as X-rays, an EKG, stress testing, or cardiac imaging, may be needed to find the cause of your pain.  TREATMENT   Treatment depends on what may be causing your chest pain. Treatment may include:   Acid blockers for heartburn.   Anti-inflammatory medicine.   Pain medicine for inflammatory conditions.   Antibiotics if an infection is present.   You may be advised to change lifestyle habits. This includes stopping smoking and avoiding caffeine and chocolate.   You may be advised to keep your head raised (elevated) when sleeping. This reduces the chance of acid going backward from your stomach into your esophagus.   Most of the time, nonspecific chest pain will improve within 2 to 3 days with rest and mild pain medicine.  HOME CARE INSTRUCTIONS   If antibiotics were prescribed, take the full amount even if you start to feel better.   For the next few days, avoid physical activities that bring on chest pain. Continue physical activities as  directed.   Do not smoke cigarettes or drink alcohol until your symptoms are gone.   Only take over-the-counter or prescription medicine for pain, discomfort, or fever as directed by your caregiver.   Follow your caregiver's suggestions for further testing if your chest pain does not go away.   Keep any follow-up appointments you made. If you do not go to an appointment, you could develop lasting (chronic) problems with pain. If there is any problem keeping an appointment, you must call to reschedule.  SEEK MEDICAL CARE IF:   You think you are having problems from the medicine you are taking. Read your medicine instructions carefully.   Your chest pain does not go away, even after treatment.   You develop a rash with blisters on your chest.  SEEK IMMEDIATE MEDICAL CARE IF:   You have increased chest pain or pain that spreads to your arm, neck, jaw, back, or belly (abdomen).   You develop shortness of breath, an increasing cough, or you are coughing up blood.   You have severe back or abdominal pain, feel sick to your stomach (nauseous) or throw up (vomit).   You develop severe weakness, fainting, or chills.   You have an oral temperature above 102 F (38.9 C), not controlled by medicine.  THIS IS AN EMERGENCY. Do not wait to see if the pain will go away. Get medical help at once. Call your local emergency services (911 in [place] Do not drive yourself to   the hospital. MAKE SURE YOU:   Understand these instructions.   Will watch your condition.   Will get help right away if you are not doing well or get worse.  Document Released: 11/23/2004 Document Revised: 10/26/2010 Document Reviewed: 09/19/2007 [employer] Patient Information 2012 [employer]. 

## 2011-02-13 NOTE — ED Provider Notes (Signed)
History     CSN: [id number] Arrival date & time: 02/13/2011 10:27 AM   First MD Initiated Contact with Patient 02/13/11 1155      Chief Complaint  Patient presents with  . Chest Pain    (Consider location/radiation/quality/duration/timing/severity/associated sxs/prior treatment) HPI Comments: Patient presents with right upper chest and shoulder pain it began this morning.  Patient states he woke up with it.  He has no associated shortness of breath, palpitations, fevers, coughing.  Patient has noted that the only thing that makes the pain worse is taking a big breath.  His pain does not change with exertion.  He states his pain is similar to when he had a pulmonary embolus in the past.  He is not currently on Coumadin because of bleeding problems he had in the past.  He does have an IVC filter in place.  Patient is otherwise comfortable at this time.  Patient is a 77 y.o. male presenting with chest pain. The history is provided by the patient and the spouse. No language interpreter was used.  Chest Pain The chest pain began 3 - 5 hours ago. Chest pain occurs constantly. The chest pain is unchanged. The pain is associated with breathing. The severity of the pain is mild. The quality of the pain is described as aching. The pain does not radiate. Chest pain is worsened by deep breathing. Pertinent negatives for primary symptoms include no fever, no fatigue, no syncope, no shortness of breath, no cough, no wheezing, no palpitations, no abdominal pain, no nausea, no vomiting, no dizziness and no altered mental status.     Past Medical History  Diagnosis Date  . Diabetes mellitus   . Chronic shoulder pain   . Hypertension   . Hyperlipidemia   . Stroke 1997  . Atrial fibrillation   . Atrial flutter   . Tachycardia-bradycardia   . Bradycardia   . GI bleeding   . Frequent falls   . BPH (benign prostatic hypertrophy)   . Asthma   . COPD (chronic obstructive pulmonary disease)   . Chronic  constipation   . Spinal stenosis   . Peripheral neuropathy   . Hard of hearing   . Post-traumatic stress   . Osteoarthritis     Past Surgical History  Procedure Date  . Insert / replace / remove pacemaker 08-16-2010  . Knee surgery     Family History  Problem Relation Age of Onset  . Diabetes Mother     died on her 76's  . Heart attack Father     died on his 43's    History  Substance Use Topics  . Smoking status: Former Smoker    Quit date: 02/27/1994  . Smokeless tobacco: Not on file  . Alcohol Use: No      Review of Systems  Constitutional: Negative.  Negative for fever, chills and fatigue.  HENT: Negative.   Eyes: Negative.  Negative for discharge and redness.  Respiratory: Negative.  Negative for cough, shortness of breath and wheezing.   Cardiovascular: Positive for chest pain. Negative for palpitations and syncope.  Gastrointestinal: Negative.  Negative for nausea, vomiting and abdominal pain.  Genitourinary: Negative.  Negative for hematuria.  Musculoskeletal: Negative.  Negative for back pain.  Skin: Negative.  Negative for color change and rash.  Neurological: Negative for dizziness, syncope and headaches.  Hematological: Negative.  Negative for adenopathy.  Psychiatric/Behavioral: Negative.  Negative for confusion and altered mental status.  All other systems reviewed and are negative.  Allergies  Review of patient's allergies indicates no known allergies.  Home Medications   Current Outpatient Rx  Name Route Sig Dispense Refill  . ACETAMINOPHEN 325 MG PO TABS Oral Take 650 mg by mouth every 6 (six) hours as needed.      . AMIODARONE HCL 200 MG PO TABS Oral Take 200 mg by mouth daily.      [name] AMLODIPINE BESYLATE 5 MG PO TABS Oral Take 5 mg by mouth daily.      . ASPIRIN 81 MG PO TABS Oral Take 81 mg by mouth daily.      [name] CALCIUM CARBONATE-VITAMIN D 500-200 MG-UNIT PO TABS Oral Take 1 tablet by mouth daily.      [name] VITAMIN D3 1000 UNITS PO CAPS  Oral Take 1 each by mouth daily.      . COLESEVELAM HCL 3.75 G PO PACK Oral Take 1 each by mouth daily. One packet with food with largest meal of day     . DOXAZOSIN MESYLATE 2 MG PO TABS Oral Take 2 mg by mouth at bedtime.      [name] EZETIMIBE 10 MG PO TABS Oral Take 10 mg by mouth daily.      [name] FINASTERIDE 5 MG PO TABS Oral Take 5 mg by mouth daily.      . FUROSEMIDE 40 MG PO TABS Oral Take 40 mg by mouth 2 (two) times daily.      [name] HYDRALAZINE HCL 25 MG PO TABS Oral Take 25 mg by mouth 3 (three) times daily.      [name] NIACIN (ANTIHYPERLIPIDEMIC) 500 MG PO TBCR Oral Take 500 mg by mouth at bedtime.     [name] PANTOPRAZOLE SODIUM 40 MG PO TBEC Oral Take 40 mg by mouth daily.      [name] POTASSIUM CHLORIDE CRYS CR 20 MEQ PO TBCR Oral Take 20 mEq by mouth daily.      [name] RAMIPRIL 10 MG PO CAPS Oral Take 10 mg by mouth 2 (two) times daily.      . TRAZODONE HCL 50 MG PO TABS Oral Take 50 mg by mouth at bedtime.        BP 147/70  Pulse 59  Temp(Src) 98.5 F (36.9 C) (Oral)  Resp 12  SpO2 92%  Physical Exam  Constitutional: He is oriented to person, place, and time. He appears well-developed and well-nourished.  Non-toxic appearance. He does not have a sickly appearance.  HENT:  Head: Normocephalic and atraumatic.  Eyes: Conjunctivae, EOM and lids are normal. Pupils are equal, round, and reactive to light.  Neck: Trachea normal, normal range of motion and full passive range of motion without pain. Neck supple.  Cardiovascular: Normal rate, regular rhythm and normal heart sounds.   Pulmonary/Chest: Effort normal and breath sounds normal. No respiratory distress. He has no wheezes. He has no rales. He exhibits tenderness.       There is mild tenderness to palpation over the patient's right upper chest and shoulder area.  Abdominal: Soft. Normal appearance. He exhibits no distension. There is no tenderness. There is no rebound and no CVA tenderness.  Musculoskeletal: Normal range of motion.  Neurological: He is  alert and oriented to person, place, and time. He has normal strength.       Patient does have some left arm and leg weakness.  These are at his baseline.  Skin: Skin is warm, dry and intact. No rash noted.  Psychiatric: He has a normal mood and affect. His behavior  is normal. Judgment and thought content normal.    ED Course  Procedures (including critical care time)  Results for orders placed during the hospital encounter of 02/13/11  CBC      Component Value Range   WBC 4.9  4.0 - 10.5 (K/uL)   RBC 4.50  4.22 - 5.81 (MIL/uL)   Hemoglobin 14.3  13.0 - 17.0 (g/dL)   HCT 41.3  24.4 - 01.0 (%)   MCV 94.4  78.0 - 100.0 (fL)   MCH 31.8  26.0 - 34.0 (pg)   MCHC 33.6  30.0 - 36.0 (g/dL)   RDW 27.2  53.6 - 64.4 (%)   Platelets 124 (*) 150 - 400 (K/uL)  DIFFERENTIAL      Component Value Range   Neutrophils Relative 43  43 - 77 (%)   Neutro Abs 2.1  1.7 - 7.7 (K/uL)   Lymphocytes Relative 39  12 - 46 (%)   Lymphs Abs 1.9  0.7 - 4.0 (K/uL)   Monocytes Relative 13 (*) 3 - 12 (%)   Monocytes Absolute 0.6  0.1 - 1.0 (K/uL)   Eosinophils Relative 4  0 - 5 (%)   Eosinophils Absolute 0.2  0.0 - 0.7 (K/uL)   Basophils Relative 0  0 - 1 (%)   Basophils Absolute 0.0  0.0 - 0.1 (K/uL)  BASIC METABOLIC PANEL      Component Value Range   Sodium 140  135 - 145 (mEq/L)   Potassium 4.0  3.5 - 5.1 (mEq/L)   Chloride 102  96 - 112 (mEq/L)   CO2 30  19 - 32 (mEq/L)   Glucose, Bld 91  70 - 99 (mg/dL)   BUN 10  6 - 23 (mg/dL)   Creatinine, Ser 0.34  0.50 - 1.35 (mg/dL)   Calcium 74.2  8.4 - 10.5 (mg/dL)   GFR calc non Af Amer 75 (*) >90 (mL/min)   GFR calc Af Amer 87 (*) >90 (mL/min)  APTT      Component Value Range   aPTT 28  24 - 37 (seconds)  PROTIME-INR      Component Value Range   Prothrombin Time 13.3  11.6 - 15.2 (seconds)   INR 0.99  0.00 - 1.49   CARDIAC PANEL(CRET KIN+CKTOT+MB+TROPI)      Component Value Range   Total CK 149  7 - 232 (U/L)   CK, MB 3.1  0.3 - 4.0 (ng/mL)    Troponin I <0.30  <0.30 (ng/mL)   Relative Index 2.1  0.0 - 2.5    Dg Chest 2 View  02/13/2011  *RADIOLOGY REPORT*  Clinical Data: Right-sided chest pain  CHEST - 2 VIEW  Comparison: 11/13/2010; 08/16/2010; 08/12/2010  Findings: Unchanged cardiac silhouette and mediastinal contours. Stable position of support apparatus.  Improved aeration of the right mid and lower lung.  Persistent elevation of the right hemidiaphragm.  No new focal parenchymal opacities.  No pleural effusion or pneumothorax.  Unchanged bones.  Shrapnel about the lateral aspect of the left anterior chest.  IMPRESSION: No acute cardiopulmonary disease.  Original Report Authenticated By: Waynard Reeds, M.D.     Date: 02/13/2011  Rate: 61  Rhythm: atrial paced rhythm  QRS Axis: normal  Intervals: normal  ST/T Wave abnormalities: normal  Conduction Disutrbances:none  Narrative Interpretation:   Old EKG Reviewed: changes noted since 11/17/2010 where T waves were inverted in leads V4 to V6     MDM  Patient presents with atypical right upper chest and  shoulder pain.  He states it was similar to when he had a pulmonary embolus before but his CT angiogram does not show a pulmonary embolus today.  Patient has normal oxygen saturations and is not tachycardic.  He is mildly hypertensive but does have a known history of hypertension on multiple medications at home for this.  Patient's pain is nonexertional as he notes that it has remained the same throughout the morning and was only worse with taking a deep breath.  Given there is no acute EKG changes and his initial set of cardiac markers is negative I feel this patient has a low likelihood of this being ACS.  I will obtain a repeat set of cardiac markers to further rule out any change but feel that this patient they'll be safely discharged home with followup with Dr. Donnie Aho is an outpatient.  Patient has no acute findings for infection and no infiltrates seen on his CAT scan either.   Notably I did review the patient's past medical records in an admission in September noted that he complained of right shoulder and right upper chest pain which was not determined to be ACS at that point in time either.        Nat Christen, MD 02/13/11 [phone]

## 2011-02-13 NOTE — ED Notes (Signed)
Pt. Alert and oriented.  Reports chest pain still an 8.  Denies SOB, or nausea.

## 2011-02-13 NOTE — ED Notes (Signed)
Pt reports he has a history of a pacemaker placed. Pt reports he also has a filter for blood clots and was previously on blood thinners. Pt reports he was fine yesterday and woke up this AM with pain to right chest that was worse with deep breathing and palpation. Pt reports pain persists at rest.

## 2011-02-13 NOTE — ED Notes (Signed)
Per ems: pt started to have right side chest pain. Pt c/o pain increased with activity and palpation. Pt denies n/v, or numbness

## 2011-04-11 ENCOUNTER — Encounter: Payer: Self-pay | Admitting: *Deleted

## 2011-06-13 ENCOUNTER — Telehealth: Payer: Self-pay | Admitting: Internal Medicine

## 2011-06-13 NOTE — Telephone Encounter (Signed)
Pt's wife requesting a call from [facility] re pacer ck, she cxl appt for tomorrow, not sure if she needs to bring pt here due to dr Donnie Aho checking his pacer, did so last week and they are doing home transmissions as well, pls advise [phone] after 200p

## 2011-06-13 NOTE — Telephone Encounter (Signed)
Spoke with Ms [name] to keep follow up with Dr Donnie Aho

## 2011-06-14 ENCOUNTER — Encounter: Payer: Medicare Other | Admitting: Internal Medicine

## 2012-06-02 ENCOUNTER — Inpatient Hospital Stay (HOSPITAL_COMMUNITY)
Admission: EM | Admit: 2012-06-02 | Discharge: 2012-06-05 | DRG: 206 | Disposition: A | Discharge status: Skilled Nursing Facility | Payer: Medicare Other | Attending: Family Medicine | Admitting: Family Medicine

## 2012-06-02 ENCOUNTER — Emergency Department (HOSPITAL_COMMUNITY): Payer: Medicare Other

## 2012-06-02 ENCOUNTER — Encounter (HOSPITAL_COMMUNITY): Payer: Self-pay

## 2012-06-02 DIAGNOSIS — H9190 Unspecified hearing loss, unspecified ear: Secondary | ICD-10-CM | POA: Diagnosis present

## 2012-06-02 DIAGNOSIS — E785 Hyperlipidemia, unspecified: Secondary | ICD-10-CM | POA: Diagnosis present

## 2012-06-02 DIAGNOSIS — S2232XA Fracture of one rib, left side, initial encounter for closed fracture: Secondary | ICD-10-CM

## 2012-06-02 DIAGNOSIS — Z950 Presence of cardiac pacemaker: Secondary | ICD-10-CM | POA: Diagnosis present

## 2012-06-02 DIAGNOSIS — I495 Sick sinus syndrome: Secondary | ICD-10-CM | POA: Diagnosis present

## 2012-06-02 DIAGNOSIS — Z833 Family history of diabetes mellitus: Secondary | ICD-10-CM

## 2012-06-02 DIAGNOSIS — I10 Essential (primary) hypertension: Secondary | ICD-10-CM | POA: Diagnosis present

## 2012-06-02 DIAGNOSIS — F4310 Post-traumatic stress disorder, unspecified: Secondary | ICD-10-CM | POA: Diagnosis present

## 2012-06-02 DIAGNOSIS — J449 Chronic obstructive pulmonary disease, unspecified: Secondary | ICD-10-CM | POA: Diagnosis present

## 2012-06-02 DIAGNOSIS — Z87891 Personal history of nicotine dependence: Secondary | ICD-10-CM

## 2012-06-02 DIAGNOSIS — S2241XA Multiple fractures of ribs, right side, initial encounter for closed fracture: Secondary | ICD-10-CM

## 2012-06-02 DIAGNOSIS — R911 Solitary pulmonary nodule: Secondary | ICD-10-CM | POA: Diagnosis present

## 2012-06-02 DIAGNOSIS — E119 Type 2 diabetes mellitus without complications: Secondary | ICD-10-CM | POA: Diagnosis present

## 2012-06-02 DIAGNOSIS — W1809XD Striking against other object with subsequent fall, subsequent encounter: Secondary | ICD-10-CM

## 2012-06-02 DIAGNOSIS — K5900 Constipation, unspecified: Secondary | ICD-10-CM | POA: Diagnosis present

## 2012-06-02 DIAGNOSIS — N289 Disorder of kidney and ureter, unspecified: Secondary | ICD-10-CM | POA: Diagnosis present

## 2012-06-02 DIAGNOSIS — I69354 Hemiplegia and hemiparesis following cerebral infarction affecting left non-dominant side: Secondary | ICD-10-CM

## 2012-06-02 DIAGNOSIS — S27329A Contusion of lung, unspecified, initial encounter: Secondary | ICD-10-CM | POA: Diagnosis present

## 2012-06-02 DIAGNOSIS — Z794 Long term (current) use of insulin: Secondary | ICD-10-CM

## 2012-06-02 DIAGNOSIS — D696 Thrombocytopenia, unspecified: Secondary | ICD-10-CM | POA: Diagnosis present

## 2012-06-02 DIAGNOSIS — R109 Unspecified abdominal pain: Secondary | ICD-10-CM

## 2012-06-02 DIAGNOSIS — I509 Heart failure, unspecified: Secondary | ICD-10-CM | POA: Diagnosis present

## 2012-06-02 DIAGNOSIS — C3410 Malignant neoplasm of upper lobe, unspecified bronchus or lung: Secondary | ICD-10-CM | POA: Diagnosis present

## 2012-06-02 DIAGNOSIS — Z9181 History of falling: Secondary | ICD-10-CM

## 2012-06-02 DIAGNOSIS — J4489 Other specified chronic obstructive pulmonary disease: Secondary | ICD-10-CM | POA: Diagnosis present

## 2012-06-02 DIAGNOSIS — R918 Other nonspecific abnormal finding of lung field: Secondary | ICD-10-CM

## 2012-06-02 DIAGNOSIS — I5022 Chronic systolic (congestive) heart failure: Secondary | ICD-10-CM | POA: Diagnosis present

## 2012-06-02 DIAGNOSIS — I4891 Unspecified atrial fibrillation: Secondary | ICD-10-CM | POA: Diagnosis present

## 2012-06-02 DIAGNOSIS — Z79899 Other long term (current) drug therapy: Secondary | ICD-10-CM

## 2012-06-02 DIAGNOSIS — W1809XA Striking against other object with subsequent fall, initial encounter: Secondary | ICD-10-CM | POA: Diagnosis present

## 2012-06-02 DIAGNOSIS — W1800XA Striking against unspecified object with subsequent fall, initial encounter: Secondary | ICD-10-CM

## 2012-06-02 DIAGNOSIS — N400 Enlarged prostate without lower urinary tract symptoms: Secondary | ICD-10-CM | POA: Diagnosis present

## 2012-06-02 DIAGNOSIS — Y92009 Unspecified place in unspecified non-institutional (private) residence as the place of occurrence of the external cause: Secondary | ICD-10-CM

## 2012-06-02 DIAGNOSIS — S2249XA Multiple fractures of ribs, unspecified side, initial encounter for closed fracture: Principal | ICD-10-CM | POA: Diagnosis present

## 2012-06-02 DIAGNOSIS — Z7982 Long term (current) use of aspirin: Secondary | ICD-10-CM

## 2012-06-02 DIAGNOSIS — Z8249 Family history of ischemic heart disease and other diseases of the circulatory system: Secondary | ICD-10-CM

## 2012-06-02 DIAGNOSIS — G609 Hereditary and idiopathic neuropathy, unspecified: Secondary | ICD-10-CM | POA: Diagnosis present

## 2012-06-02 DIAGNOSIS — I69959 Hemiplegia and hemiparesis following unspecified cerebrovascular disease affecting unspecified side: Secondary | ICD-10-CM

## 2012-06-02 DIAGNOSIS — W19XXXA Unspecified fall, initial encounter: Secondary | ICD-10-CM

## 2012-06-02 DIAGNOSIS — N2889 Other specified disorders of kidney and ureter: Secondary | ICD-10-CM | POA: Diagnosis present

## 2012-06-02 HISTORY — DX: Striking against unspecified object with subsequent fall, initial encounter: W18.00XA

## 2012-06-02 HISTORY — DX: Multiple fractures of ribs, right side, initial encounter for closed fracture: S22.41XA

## 2012-06-02 HISTORY — DX: Chronic systolic (congestive) heart failure: I50.22

## 2012-06-02 LAB — COMPREHENSIVE METABOLIC PANEL
ALT: 11 U/L (ref 0–53)
AST: 20 U/L (ref 0–37)
Alkaline Phosphatase: 58 U/L (ref 39–117)
CO2: 27 mEq/L (ref 19–32)
Calcium: 9.5 mg/dL (ref 8.4–10.5)
Chloride: 101 mEq/L (ref 96–112)
GFR calc non Af Amer: 60 mL/min — ABNORMAL LOW (ref >90)
Potassium: 4.1 mEq/L (ref 3.5–5.1)
Sodium: 138 mEq/L (ref 135–145)
Total Bilirubin: 0.4 mg/dL (ref 0.3–1.2)

## 2012-06-02 LAB — TSH: TSH: 1.216 u[IU]/mL (ref 0.350–4.500)

## 2012-06-02 LAB — CBC WITH DIFFERENTIAL/PLATELET
Basophils Absolute: 0 10*3/uL (ref 0.0–0.1)
Lymphocytes Relative: 21 % (ref 12–46)
Lymphs Abs: 1.5 10*3/uL (ref 0.7–4.0)
Neutro Abs: 5.1 10*3/uL (ref 1.7–7.7)
Platelets: 113 10*3/uL — ABNORMAL LOW (ref 150–400)
RBC: 4.3 MIL/uL (ref 4.22–5.81)
RDW: 14.5 % (ref 11.5–15.5)
WBC: 7.3 10*3/uL (ref 4.0–10.5)

## 2012-06-02 LAB — PROTIME-INR: Prothrombin Time: 13.4 seconds (ref 11.6–15.2)

## 2012-06-02 LAB — GLUCOSE, CAPILLARY: Glucose-Capillary: 121 mg/dL — ABNORMAL HIGH (ref 70–99)

## 2012-06-02 LAB — POCT I-STAT TROPONIN I: Troponin i, poc: 0.01 ng/mL (ref 0.00–0.08)

## 2012-06-02 MED ORDER — COLESEVELAM HCL 625 MG PO TABS
3750.0000 mg | ORAL_TABLET | Freq: Every day | ORAL | Status: DC
  Administered 2012-06-03 – 2012-06-05 (×3): 3750 mg via ORAL
  Filled 2012-06-02 (×4): qty 6

## 2012-06-02 MED ORDER — POTASSIUM CHLORIDE CRYS ER 20 MEQ PO TBCR
20.0000 meq | EXTENDED_RELEASE_TABLET | Freq: Every day | ORAL | Status: DC
  Administered 2012-06-02 – 2012-06-05 (×4): 20 meq via ORAL
  Filled 2012-06-02 (×5): qty 1

## 2012-06-02 MED ORDER — POTASSIUM CHLORIDE IN NACL 20-0.9 MEQ/L-% IV SOLN
INTRAVENOUS | Status: DC
  Administered 2012-06-02: 20:00:00 via INTRAVENOUS
  Filled 2012-06-02 (×3): qty 1000

## 2012-06-02 MED ORDER — DOXAZOSIN MESYLATE 2 MG PO TABS
2.0000 mg | ORAL_TABLET | Freq: Every day | ORAL | Status: DC
  Administered 2012-06-02 – 2012-06-04 (×3): 2 mg via ORAL
  Filled 2012-06-02 (×4): qty 1

## 2012-06-02 MED ORDER — LEVALBUTEROL HCL 0.63 MG/3ML IN NEBU
0.6300 mg | INHALATION_SOLUTION | Freq: Four times a day (QID) | RESPIRATORY_TRACT | Status: DC
  Filled 2012-06-02 (×3): qty 3

## 2012-06-02 MED ORDER — ACETAMINOPHEN 650 MG RE SUPP: 650.0000 mg | Freq: Four times a day (QID) | RECTAL | Status: DC | PRN

## 2012-06-02 MED ORDER — HYDROCODONE-ACETAMINOPHEN 5-325 MG PO TABS
1.0000 | ORAL_TABLET | ORAL | Status: DC | PRN
  Administered 2012-06-02 – 2012-06-04 (×5): 1 via ORAL
  Administered 2012-06-05 (×2): 2 via ORAL
  Filled 2012-06-02: qty 2
  Filled 2012-06-02: qty 1
  Filled 2012-06-02: qty 2
  Filled 2012-06-02 (×4): qty 1

## 2012-06-02 MED ORDER — LEVALBUTEROL HCL 0.63 MG/3ML IN NEBU
0.6300 mg | INHALATION_SOLUTION | Freq: Four times a day (QID) | RESPIRATORY_TRACT | Status: DC
  Administered 2012-06-02 – 2012-06-03 (×4): 0.63 mg via RESPIRATORY_TRACT
  Filled 2012-06-02 (×6): qty 3

## 2012-06-02 MED ORDER — MORPHINE SULFATE 2 MG/ML IJ SOLN: 2.0000 mg | INTRAMUSCULAR | Status: DC | PRN

## 2012-06-02 MED ORDER — VITAMIN D3 25 MCG (1000 UT) PO CAPS: 1000.0000 [IU] | ORAL_CAPSULE | Freq: Every day | ORAL | Status: DC

## 2012-06-02 MED ORDER — LEVALBUTEROL HCL 0.63 MG/3ML IN NEBU: 0.6300 mg | INHALATION_SOLUTION | Freq: Four times a day (QID) | RESPIRATORY_TRACT | Status: DC

## 2012-06-02 MED ORDER — ACETAMINOPHEN 325 MG PO TABS: 650.0000 mg | ORAL_TABLET | Freq: Four times a day (QID) | ORAL | Status: DC | PRN

## 2012-06-02 MED ORDER — INSULIN ASPART 100 UNIT/ML ~~LOC~~ SOLN: 0.0000 [IU] | Freq: Every day | SUBCUTANEOUS | Status: DC

## 2012-06-02 MED ORDER — RAMIPRIL 10 MG PO CAPS
10.0000 mg | ORAL_CAPSULE | Freq: Two times a day (BID) | ORAL | Status: DC
Start: 2012-06-02 — End: 2012-06-05
  Administered 2012-06-02 – 2012-06-05 (×6): 10 mg via ORAL
  Filled 2012-06-02 (×7): qty 1

## 2012-06-02 MED ORDER — COLESEVELAM HCL 3.75 G PO PACK: 1.0000 | PACK | Freq: Every day | ORAL | Status: DC

## 2012-06-02 MED ORDER — BENZONATATE 100 MG PO CAPS
100.0000 mg | ORAL_CAPSULE | Freq: Three times a day (TID) | ORAL | Status: DC
  Administered 2012-06-02 – 2012-06-05 (×8): 100 mg via ORAL
  Filled 2012-06-02 (×10): qty 1

## 2012-06-02 MED ORDER — GUAIFENESIN-DM 100-10 MG/5ML PO SYRP: 5.0000 mL | ORAL_SOLUTION | ORAL | Status: DC | PRN

## 2012-06-02 MED ORDER — FUROSEMIDE 40 MG PO TABS
40.0000 mg | ORAL_TABLET | Freq: Two times a day (BID) | ORAL | Status: DC
  Administered 2012-06-02 – 2012-06-05 (×6): 40 mg via ORAL
  Filled 2012-06-02 (×7): qty 1

## 2012-06-02 MED ORDER — NIACIN ER (ANTIHYPERLIPIDEMIC) 500 MG PO TBCR
500.0000 mg | EXTENDED_RELEASE_TABLET | Freq: Every day | ORAL | Status: DC
  Administered 2012-06-02 – 2012-06-04 (×3): 500 mg via ORAL
  Filled 2012-06-02 (×4): qty 1

## 2012-06-02 MED ORDER — EZETIMIBE 10 MG PO TABS
10.0000 mg | ORAL_TABLET | Freq: Every day | ORAL | Status: DC
  Administered 2012-06-02 – 2012-06-05 (×4): 10 mg via ORAL
  Filled 2012-06-02 (×4): qty 1

## 2012-06-02 MED ORDER — AMIODARONE HCL 200 MG PO TABS
200.0000 mg | ORAL_TABLET | Freq: Every day | ORAL | Status: DC
  Administered 2012-06-02 – 2012-06-05 (×4): 200 mg via ORAL
  Filled 2012-06-02 (×4): qty 1

## 2012-06-02 MED ORDER — HYDRALAZINE HCL 25 MG PO TABS
25.0000 mg | ORAL_TABLET | Freq: Three times a day (TID) | ORAL | Status: DC
  Administered 2012-06-02 – 2012-06-05 (×8): 25 mg via ORAL
  Filled 2012-06-02 (×11): qty 1

## 2012-06-02 MED ORDER — FINASTERIDE 5 MG PO TABS
5.0000 mg | ORAL_TABLET | Freq: Every day | ORAL | Status: DC
  Administered 2012-06-02 – 2012-06-05 (×4): 5 mg via ORAL
  Filled 2012-06-02 (×4): qty 1

## 2012-06-02 MED ORDER — VITAMIN D3 25 MCG (1000 UNIT) PO TABS
1000.0000 [IU] | ORAL_TABLET | Freq: Every day | ORAL | Status: DC
  Administered 2012-06-02 – 2012-06-05 (×4): 1000 [IU] via ORAL
  Filled 2012-06-02 (×4): qty 1

## 2012-06-02 MED ORDER — PANTOPRAZOLE SODIUM 40 MG PO TBEC
40.0000 mg | DELAYED_RELEASE_TABLET | Freq: Every day | ORAL | Status: DC
  Administered 2012-06-02 – 2012-06-05 (×4): 40 mg via ORAL
  Filled 2012-06-02 (×4): qty 1

## 2012-06-02 MED ORDER — POTASSIUM CHLORIDE IN NACL 20-0.9 MEQ/L-% IV SOLN: INTRAVENOUS | Status: DC

## 2012-06-02 MED ORDER — AMLODIPINE BESYLATE 5 MG PO TABS
5.0000 mg | ORAL_TABLET | Freq: Every day | ORAL | Status: DC
  Administered 2012-06-02 – 2012-06-05 (×4): 5 mg via ORAL
  Filled 2012-06-02 (×4): qty 1

## 2012-06-02 MED ORDER — IOHEXOL 350 MG/ML SOLN
80.0000 mL | Freq: Once | INTRAVENOUS | Status: AC | PRN
  Administered 2012-06-02: 80 mL via INTRAVENOUS

## 2012-06-02 MED ORDER — TRAZODONE HCL 50 MG PO TABS
50.0000 mg | ORAL_TABLET | Freq: Every day | ORAL | Status: DC
  Administered 2012-06-02 – 2012-06-04 (×3): 50 mg via ORAL
  Filled 2012-06-02 (×4): qty 1

## 2012-06-02 MED ORDER — INSULIN ASPART 100 UNIT/ML ~~LOC~~ SOLN
0.0000 [IU] | Freq: Three times a day (TID) | SUBCUTANEOUS | Status: DC
  Administered 2012-06-05: 1 [IU] via SUBCUTANEOUS

## 2012-06-02 NOTE — H&P (Addendum)
[facility] History and Physical  [name]:[id number] DOB: 1929/02/05 DOA: 06/02/2012  Referring physician: ED physician, Dr. Ignacia Palma. PCP: Ezequiel Kayser, MD  Specialists: Cardiologist, Dr. Donnie Aho  Chief Complaint: Unable to get up secondary to left-sided chest wall pain, status post fall.  HPI: [name] is a 79 y.o. male with a history significant for diabetes mellitus, tachycardia-bradycardia syndrome, status post pacemaker, frequent falls, morbid obesity, and left-sided hemiparesis from a previous stroke, who presents today with the inability to get up after a fall. The history is provided by both the patient and his wife. Accordingly, he generally ambulates with a walker secondary to left-sided hemiparesis from a stroke many years ago. Last evening, he stumbled and fell on his left side, apparently hitting a table. He did not loose consciousness. There was no head injury. He developed sharp left-sided chest pain. His wife was unable to assist him up. She called EMS who assisted him back to the wheelchair. Later that evening, he was able to ambulate in the wheelchair to go back to bed. However, this morning, when he was trying to use the bedside commode, he was unable to get out of the wheelchair to sit on the commode, primarily because of  left-sided chest wall pain. He describes his pain as sharp and constant. It is 10 over 10 in intensity. It worsens when he breathes in. He has had no recent substernal chest pain. He has chronic shortness of breath. Over the past few weeks, he has had a congested cough but he has been unable to expectorate. He has had some intermittent wheezing. He denies fever, chills, nausea, vomiting, abdominal pain, or pain with urination. He has had no recent headache or dizziness. He stopped smoking more than 20 years ago.  In the emergency department, he is afebrile and hemodynamically stable, though he is hypertensive. His lab data are significant for  a platelet count of 113, but no other significant abnormalities. His EKG reveals atrial fibrillation with frequent PVCs and a heart rate of 74 beats per minute. His troponin I is negative. His d-dimer was elevated at 13.16. CT angiogram of his chest was ordered and it revealed no acute pulmonary embolus. However, it did reveal a 2.5 cm nodule/mass in the left upper lobe of the lung concerning for bronchogenic carcinoma and a suspicious nodule within the upper pole of the left kidney. Rib x-rays revealed acute left seventh and eighth rib fractures with contusion of the adjacent left lung (no contusion seen on CT of the chest). He is being admitted for further evaluation and management.     Review of Systems: Positive as above in history present illness. The patient denies anorexia, fever, weight loss,, syncope, hemoptysis, abdominal pain, melena, hematochezia, severe indigestion/heartburn, hematuria,  suspicious skin lesions, transient blindness, depression, unusual weight change, abnormal bleeding, enlarged lymph nodes, angioedema, and breast masses.    Past Medical History  Diagnosis Date  . Diabetes mellitus   . Chronic shoulder pain   . Hypertension   . Hyperlipidemia   . Stroke 1997  . Atrial fibrillation   . Atrial flutter   . Tachycardia-bradycardia   . Bradycardia   . GI bleeding   . Frequent falls   . BPH (benign prostatic hypertrophy)   . Asthma   . COPD (chronic obstructive pulmonary disease)   . Chronic constipation   . Spinal stenosis   . Peripheral neuropathy   . Hard of hearing   . Post-traumatic stress   .  Osteoarthritis   . Arm fracture, left   . War injury due to shrapnel     Left shoulder  . Fall against object 06/02/2012  . Multiple closed fractures of ribs of right side 06/02/2012   Past Surgical History  Procedure Laterality Date  . Insert / replace / remove pacemaker  08-16-2010  . Knee surgery    . Ivc filter     Social History: He is married. He lives in  [name] with his wife. He has one son. He is retired from the [employer] and from [employer]. He stopped smoking approximately 20 years ago after smoking for approximately 25 years. He denies alcohol and illicit drug use. He ambulates with both a walker and a wheelchair. He no longer drives.   No Known Allergies  Family History  Problem Relation Age of Onset  . Diabetes Mother     died on her 20's  . Heart attack Father     died on his 9's  . Peripheral vascular disease Mother     Prior to Admission medications   Medication Sig Start Date End Date Taking? Authorizing Provider  acetaminophen (TYLENOL) 325 MG tablet Take 650 mg by mouth every 6 (six) hours as needed.     Yes Historical Provider, MD  amiodarone (PACERONE) 200 MG tablet Take 200 mg by mouth daily.     Yes Historical Provider, MD  amLODipine (NORVASC) 5 MG tablet Take 5 mg by mouth daily.     Yes Historical Provider, MD  aspirin 81 MG tablet Take 81 mg by mouth daily.     Yes Historical Provider, MD  calcium-vitamin D (OSCAL WITH D) 500-200 MG-UNIT per tablet Take 1 tablet by mouth daily.     Yes Historical Provider, MD  Cholecalciferol (VITAMIN D3) 1000 UNITS CAPS Take 1 each by mouth daily.     Yes Historical Provider, MD  Colesevelam HCl [facility]) 3.75 G PACK Take 1 each by mouth daily. One packet with food with largest meal of day    Yes Historical Provider, MD  doxazosin (CARDURA) 2 MG tablet Take 2 mg by mouth at bedtime.     Yes Historical Provider, MD  ezetimibe (ZETIA) 10 MG tablet Take 10 mg by mouth daily.     Yes Historical Provider, MD  finasteride (PROSCAR) 5 MG tablet Take 5 mg by mouth daily.     Yes Historical Provider, MD  furosemide (LASIX) 40 MG tablet Take 40 mg by mouth 2 (two) times daily.     Yes Historical Provider, MD  hydrALAZINE (APRESOLINE) 25 MG tablet Take 25 mg by mouth 3 (three) times daily.     Yes Historical Provider, MD  niacin (NIASPAN) 500 MG CR tablet Take 500 mg by mouth at bedtime.     Yes Historical Provider, MD  pantoprazole (PROTONIX) 40 MG tablet Take 40 mg by mouth daily.     Yes Historical Provider, MD  potassium chloride SA (K-DUR,KLOR-CON) 20 MEQ tablet Take 20 mEq by mouth daily.     Yes Historical Provider, MD  ramipril (ALTACE) 10 MG capsule Take 10 mg by mouth 2 (two) times daily.     Yes Historical Provider, MD  traZODone (DESYREL) 50 MG tablet Take 50 mg by mouth at bedtime.     Yes Historical Provider, MD   Physical Exam: Filed Vitals:   06/02/12 5621 06/02/12 0932 06/02/12 1324 06/02/12 1324  BP:  165/79 186/100 186/100  Pulse:  70 69 72  Temp:  98.7 F (37.1 C)    TempSrc:  Oral    Resp:  20  20  SpO2: 97% 94% 91% 90%     General:  Alert, hard of hearing, morbidly obese African-American man sitting up in bed in no acute distress.  Head: No nodules or lacerations.  Eyes: Pupils are small, but round and reactive to light. Extraocular movements are intact. Conjunctivae are clear. Sclerae are white.  ENT: Oropharynx reveals no teeth. It is membranes are moist. No posterior exudates or erythema.  Neck: Supple and obese. No adenopathy, no thyromegaly, no JVD.  Cardiovascular: S1, S2, with a rare ectopic beats versus irregular, irregular.  Respiratory: Breathing is mildly labored. Globally, there are decreased breath sounds and some splinting on the left. He has faint upper airway wheezes and a wet cough which she was unable to expectorate.  Abdomen: Positive bowel sounds, soft, obese, nontender, nondistended.  Skin: Fair turgor.  Musculoskeletal: No acute hot red joints appreciated. He does have moderate tenderness over the posterior left chest. He has mild stiffness in his left knee, but no knee effusion or erythema. Pedal pulses barely palpable. No pedal edema.  Psychiatric: He is alert and oriented x2.  Neurologic: Faint left-sided facial droop which is likely chronic. He has a left pronator drift which is chronic. He has approximately 3+  to 4 minus over 5 left lower extremity strength and 4+ over 5 left upper extremity strength. On the right, his strength is 5 over 5. He has global decrease in sensation on the left. He is hard of hearing. Otherwise, his cranial nerves are intact.  Labs on Admission:  Basic Metabolic Panel:  Recent Labs Lab 06/02/12 1031  NA 138  K 4.1  CL 101  CO2 27  GLUCOSE 128*  BUN 18  CREATININE 1.11  CALCIUM 9.5   Liver Function Tests:  Recent Labs Lab 06/02/12 1031  AST 20  ALT 11  ALKPHOS 58  BILITOT 0.4  PROT 7.5  ALBUMIN 4.0   No results found for this basename: LIPASE, AMYLASE,  in the last 168 hours No results found for this basename: AMMONIA,  in the last 168 hours CBC:  Recent Labs Lab 06/02/12 1031  WBC 7.3  NEUTROABS 5.1  HGB 13.5  HCT 39.6  MCV 92.1  PLT 113*   Cardiac Enzymes: No results found for this basename: CKTOTAL, CKMB, CKMBINDEX, TROPONINI,  in the last 168 hours  BNP (last 3 results)  Recent Labs  06/02/12 1031  PROBNP 279.3   CBG: No results found for this basename: GLUCAP,  in the last 168 hours  Radiological Exams on Admission: Dg Ribs Unilateral W/chest Left  06/02/2012  *RADIOLOGY REPORT*  Clinical Data: Status post fall.  Pain.  LEFT RIBS AND CHEST - 3+ VIEW  Comparison: 02/13/2011  Findings: There is a right chest wall pacer device with lead in the right atrial appendage and right ventricle.  Heart size is mildly enlarged.  No pleural effusion or edema.  There is a focal opacity within the left midlung. There are adjacent seventh and eighth left posterior rib fractures.  The right lung appears clear. The patient is known to have a IVC filter.  Chronic healed fracture of the left humerus noted.  IMPRESSION:  1.  Acute left seventh and eighth rib fractures with contusion of the adjacent left lung.   Original Report Authenticated By: Signa Kell, M.D.    Ct Head Wo Contrast  06/02/2012  *RADIOLOGY REPORT*  Clinical Data:  Weakness post fall.   CT HEAD WITHOUT CONTRAST  Technique:  Contiguous axial images were obtained from the base of the skull through the vertex without contrast.  Comparison: 10/25/2009  Findings: Atherosclerotic and physiologic intracranial calcifications. White matter hypoattenuation and mild encephalomalacia throughout the right MCA distribution as before, with some associated ex vacuo dilatation of the adjacent right lateral ventricle.  Elsewhere mild parenchymal atrophy. Patchy areas of hypoattenuation in deep and periventricular white matter bilaterally. Negative for acute intracranial hemorrhage, mass lesion, acute infarction, midline shift, or mass-effect. Acute infarct may be inapparent on noncontrast CT. No hydrocephalus. Bone windows demonstrate no focal lesion.  IMPRESSION:  1. Negative for bleed or other acute intracranial process.  2. Atrophy and nonspecific white matter changes. 3.  Stable right MCA distribution encephalomalacia.   Original Report Authenticated By: D. Andria Rhein, MD    Ct Angio Chest Pe W/cm &/or Wo Cm  06/02/2012  *RADIOLOGY REPORT*  Clinical Data: Chest pain  CT ANGIOGRAPHY CHEST  Technique:  Multidetector CT imaging of the chest using the standard protocol during bolus administration of intravenous contrast. Multiplanar reconstructed images including MIPs were obtained and reviewed to evaluate the vascular anatomy.  Contrast: 80mL OMNIPAQUE IOHEXOL 350 MG/ML SOLN  Comparison: 02/13/2011  Findings: Lungs/pleura: There is no pleural effusion identified. Within the left upper lobe there is a suspicious nodule measuring 2.5 x 2.2 cm, image 41/series 5.  Moderate changes of emphysema. No pulmonary contusion identified.  Heart/Mediastinum: Mild cardiac enlargement.  Coronary artery calcifications involve the RCA, LAD and left circumflex coronary arteries.  No pericardial effusion.  There is no enlarged mediastinal or hilar lymph nodes.  The pulmonary artery appears patent.  There is no lobe bar or  segmental pulmonary arterial embolus identified.  Upper abdomen: The adrenal glands both appear normal. Suspicious appearing cyst is noted arising from the upper pole of the left kidney measuring 4.9 cm.  Bones/Musculoskeletal:  There is mild multilevel spondylosis within the thoracic spine.  Acute left sixth and seventh rib fractures are noted.  No aggressive lytic or sclerotic bone lesions identified.  IMPRESSION:  1.  No evidence for acute pulmonary embolus. 2.  There is a nodule in the left upper lobe which has increased in size from previous exam.  This is concerning for primary bronchogenic carcinoma. 3.  Suspicious nodule within the upper pole the left kidney is only partially visualized.  Advise further evaluation with non emergent cross-sectional imaging through the kidneys. In this patient who has a pacemaker the study of choice would be a pre and post contrast CT of the kidneys.   Original Report Authenticated By: Signa Kell, M.D.     EKG: Independently reviewed. As above in history present illness.   Assessment/Plan Principal Problem:   Fall against object Active Problems:   Multiple closed fractures of ribs of right side   Nodule of left lung   Left renal mass   Chronic obstructive pulmonary disease with bronchospasm   Hemiparesis affecting left side as late effect of cerebrovascular accident   Tachy-brady syndrome   Cardiac pacemaker   Diabetes mellitus, type 2   Morbid obesity   1. Status post fall with resultant left-sided rib fractures. The patient is morbidly obese and has chronic left-sided hemiparesis from a previous stroke. A fall apparently he was secondary to a trip or a stumble rather than a syncopal episode. He is chronically debilitated and has a history of frequent falls. His pain will be treated accordingly. He'll need physical  therapy for strengthening and ambulation. 2. Left upper lobe lung mass/nodule measuring 2.5 cm. This is an incidental finding. He has a  remote history of tobacco abuse. He has radiographic findings of emphysema. The nodule is suspicious for bronchogenic carcinoma according to radiology. 3. Left renal nodule. This nodule is also suspicious according to radiology. 4. Emphysema/COPD with bronchospasms. I do not believe he has acute bronchitis or COPD exacerbation. I believe he has chronic bronchospasms or a chronic bronchitic component. He is afebrile and he does not have an elevated white blood cell count. Because of the rib fractures, he will be treated with bronchodilators and cough suppressants to try to ease his cough and his wheezing. 5. History of tachybradycardia syndrome/atrial fibrillation/status post pacemaker in the past. We'll continue amiodarone. We'll hold aspirin given the recent fall, thrombocytopenia, and possible diagnostic intervention. 6. Hypertension. He is treated with multiple antihypertensive medications. These will be restarted. 7. Type 2 diabetes mellitus. Apparently this is treated with diet. While he is hospitalized, will start sliding scale NovoLog and monitor CBGs.      Plan: 1. Will treat the patient's rib fractures with as needed opiate analgesics. 2. We'll start bronchodilator therapy with Xopenex. Will add Tessalon Perles as needed for cough. No need for antibiotics at this point. 3. We'll hold aspirin and pharmacological DVT prophylaxis in light of possible biopsy intervention and thrombocytopenia. Will start SCDs. Would restart aspirin cautiously after anticipated biopsy. 4. Will restart the patient's chronic antihypertensive medications and amiodarone. 5. Sliding scale NovoLog to treat diabetes. 6. Check a vitamin B12 level and TSH to assess thrombocytopenia. 7. I discussed diagnostic biopsy with interventional radiologist Dr. Fredia Sorrow. He reviewed the CT scan of the chest and thought that the left upper lung mass could be amenable to biopsy, but he recommended obtaining a CT of his abdomen and  pelvis  to see if there was a another suspicious area to biopsy first. If not, he would consider biopsy of the left upper lobe nodule. 8. We'll order a CT of the abdomen and pelvis with contrast in the morning. We'll hydrate the patient overnight because he had just received IV contrast with the CT angiogram. We'll monitor his renal function closely. Watch for for worsening hypertension. We'll continue twice a day dosing of Lasix.      Curbside consult: Interventional radiologist Dr. Fredia Sorrow.  Code Status: Full code Family Communication: Discussed with wife Disposition Plan: Plan discharge to home, length of stay to be determined.  Time spent: Greater than one hour.  [facility] Pager [phone]  If 7PM-7AM, please contact night-coverage [email or web address] Password [facility] 06/02/2012, 4:37 PM     Addendum:  I just reviewed an echocardiogram from 2011. The patient has ejection fraction of 20-25%. Therefore, will decrease the IV fluid hydration from the original rate. We'll continue twice a day dosing of Lasix.

## 2012-06-02 NOTE — ED Notes (Signed)
[phone].  Pt's wife.

## 2012-06-02 NOTE — ED Notes (Signed)
Pt fell last night and did not want to seek treatment.  Per EMS, pt's wife states this morning he seems weaker than normal.  Pt with hx of CVA with L side deficits.

## 2012-06-02 NOTE — ED Notes (Signed)
Patient transported to CT 

## 2012-06-02 NOTE — ED Notes (Signed)
CT notified to get patient.
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
## 2012-06-03 ENCOUNTER — Encounter (HOSPITAL_COMMUNITY): Payer: Self-pay | Admitting: Radiology

## 2012-06-03 ENCOUNTER — Inpatient Hospital Stay (HOSPITAL_COMMUNITY): Payer: Medicare Other

## 2012-06-03 DIAGNOSIS — E119 Type 2 diabetes mellitus without complications: Secondary | ICD-10-CM

## 2012-06-03 DIAGNOSIS — W19XXXA Unspecified fall, initial encounter: Secondary | ICD-10-CM

## 2012-06-03 DIAGNOSIS — W1809XA Striking against other object with subsequent fall, initial encounter: Secondary | ICD-10-CM

## 2012-06-03 DIAGNOSIS — J4489 Other specified chronic obstructive pulmonary disease: Secondary | ICD-10-CM

## 2012-06-03 DIAGNOSIS — Y92009 Unspecified place in unspecified non-institutional (private) residence as the place of occurrence of the external cause: Secondary | ICD-10-CM

## 2012-06-03 DIAGNOSIS — I5022 Chronic systolic (congestive) heart failure: Secondary | ICD-10-CM

## 2012-06-03 DIAGNOSIS — J9801 Acute bronchospasm: Secondary | ICD-10-CM

## 2012-06-03 DIAGNOSIS — I509 Heart failure, unspecified: Secondary | ICD-10-CM

## 2012-06-03 DIAGNOSIS — R222 Localized swelling, mass and lump, trunk: Secondary | ICD-10-CM

## 2012-06-03 DIAGNOSIS — J449 Chronic obstructive pulmonary disease, unspecified: Secondary | ICD-10-CM

## 2012-06-03 DIAGNOSIS — S2239XA Fracture of one rib, unspecified side, initial encounter for closed fracture: Secondary | ICD-10-CM

## 2012-06-03 LAB — CBC
HCT: 40.7 % (ref 39.0–52.0)
MCH: 32.4 pg (ref 26.0–34.0)
MCHC: 35.1 g/dL (ref 30.0–36.0)
MCV: 92.3 fL (ref 78.0–100.0)
Platelets: 93 10*3/uL — ABNORMAL LOW (ref 150–400)
RDW: 14.7 % (ref 11.5–15.5)
WBC: 7.8 10*3/uL (ref 4.0–10.5)

## 2012-06-03 LAB — COMPREHENSIVE METABOLIC PANEL
ALT: 9 U/L (ref 0–53)
AST: 16 U/L (ref 0–37)
Albumin: 3.7 g/dL (ref 3.5–5.2)
Alkaline Phosphatase: 54 U/L (ref 39–117)
BUN: 15 mg/dL (ref 6–23)
Chloride: 105 mEq/L (ref 96–112)
Potassium: 4.2 mEq/L (ref 3.5–5.1)
Sodium: 140 mEq/L (ref 135–145)
Total Bilirubin: 0.6 mg/dL (ref 0.3–1.2)
Total Protein: 7 g/dL (ref 6.0–8.3)

## 2012-06-03 LAB — GLUCOSE, CAPILLARY
Glucose-Capillary: 109 mg/dL — ABNORMAL HIGH (ref 70–99)
Glucose-Capillary: 121 mg/dL — ABNORMAL HIGH (ref 70–99)

## 2012-06-03 LAB — HEMOGLOBIN A1C: Hgb A1c MFr Bld: 6.3 % — ABNORMAL HIGH (ref <5.7)

## 2012-06-03 MED ORDER — IOHEXOL 300 MG/ML  SOLN
25.0000 mL | INTRAMUSCULAR | Status: AC
  Administered 2012-06-03 (×2): 25 mL via ORAL

## 2012-06-03 MED ORDER — IOHEXOL 300 MG/ML  SOLN
100.0000 mL | Freq: Once | INTRAMUSCULAR | Status: AC | PRN
  Administered 2012-06-03: 100 mL via INTRAVENOUS

## 2012-06-03 NOTE — Progress Notes (Signed)
Patient ID: EVO ADERMAN, male   DOB: May 17, 1928, 79 y.o.   MRN: [id number]   Request rec'd for Left lung nodule biopsy or Left renal mass biopsy  Discussed with Dr Grace Isaac  He has reviewed imaging  Rec: need to fully evaluate renal lesion          With complete CT abd/pelvis           Probably could bx Lung lesion, has grown from 1 cm to 2.5 cm in 1.5 yrs.           Need OP PET           Need rib fractures to heal               The lung lesion is directly beneath where rib fxs are  Discussed with Dr Cena Benton He is agreeable Will dc pt then order OP PET and bx through pts primary MD

## 2012-06-03 NOTE — Progress Notes (Signed)
[facility] PROGRESS NOTE  [name] MWU:[id number] DOB: 1928-07-04 DOA: 06/02/2012 PCP: Ezequiel Kayser, MD  Assessment/Plan: 1. Fall with rib fractures - PT recommending SNF. Placed social work consult for snf placement.  Discussed with wife who was in agreement. - treat symptomatically  2. LUL nodule/nodule at pole of left upper pole of left kidney - At this point radiologist would like to have patient obtain PET scan as outpatient.  Currently patient has broken ribs overlying spot on lung where the specialist would like to biopsy.  Therefore he is recommending that the patient return after his ribs have healed some. - Plan will be to discharge patient to SNF with plans on having PET scan and returning for biopsy. - CT scan of abdomen and pelvis.  3. HTN - continue current regimen and continue to monitor.  - likely exacerbated due to pain from rib fractures  4. DM -  Continue sliding scale insulin -  Blood sugars relatively well controlled.  5. CHF - On lasix 40 mg po bid - continue ramipril - saline lock at this point.   Code Status: full Family Communication: discussed with wife on phone Disposition Plan: To SNF with further work up planned.    Consultants:  IR for biopsy  Procedures:  none  Antibiotics:  None  HPI/Subjective: Pt has no new complaints.  Still having left rib discomfort. No acute issues overnight.  Objective: Filed Vitals:   06/03/12 0500 06/03/12 0843 06/03/12 0917 06/03/12 0950  BP: 185/84  146/74   Pulse: 59     Temp: 99 F (37.2 C)     TempSrc: Oral     Resp: 18     SpO2: 94% 95%  92%    Intake/Output Summary (Last 24 hours) at 06/03/12 1300 Last data filed at 06/03/12 0900  Gross per 24 hour  Intake      0 ml  Output      0 ml  Net      0 ml   There were no vitals filed for this visit.  Exam:   General:  Pt in NAD, Alert and Awake  Cardiovascular: S1 and S2, no rubs  Respiratory: no increased work of  breathing, no wheezes  Abdomen: soft, NT, ND  Musculoskeletal: no cyanosis or clubbing.   Data Reviewed: Basic Metabolic Panel:  Recent Labs Lab 06/02/12 1031 06/03/12 0500  NA 138 140  K 4.1 4.2  CL 101 105  CO2 27 27  GLUCOSE 128* 122*  BUN 18 15  CREATININE 1.11 0.94  CALCIUM 9.5 9.1   Liver Function Tests:  Recent Labs Lab 06/02/12 1031 06/03/12 0500  AST 20 16  ALT 11 9  ALKPHOS 58 54  BILITOT 0.4 0.6  PROT 7.5 7.0  ALBUMIN 4.0 3.7   No results found for this basename: LIPASE, AMYLASE,  in the last 168 hours No results found for this basename: AMMONIA,  in the last 168 hours CBC:  Recent Labs Lab 06/02/12 1031 06/03/12 0500  WBC 7.3 7.8  NEUTROABS 5.1  --   HGB 13.5 14.3  HCT 39.6 40.7  MCV 92.1 92.3  PLT 113* 93*   Cardiac Enzymes: No results found for this basename: CKTOTAL, CKMB, CKMBINDEX, TROPONINI,  in the last 168 hours BNP (last 3 results)  Recent Labs  06/02/12 1031  PROBNP 279.3   CBG:  Recent Labs Lab 06/02/12 1742 06/02/12 2208 06/03/12 0737  GLUCAP 125* 121* 109*    No results found for  this or any previous visit (from the past 240 hour(s)).   Studies: Dg Ribs Unilateral W/chest Left  06/02/2012  *RADIOLOGY REPORT*  Clinical Data: Status post fall.  Pain.  LEFT RIBS AND CHEST - 3+ VIEW  Comparison: 02/13/2011  Findings: There is a right chest wall pacer device with lead in the right atrial appendage and right ventricle.  Heart size is mildly enlarged.  No pleural effusion or edema.  There is a focal opacity within the left midlung. There are adjacent seventh and eighth left posterior rib fractures.  The right lung appears clear. The patient is known to have a IVC filter.  Chronic healed fracture of the left humerus noted.  IMPRESSION:  1.  Acute left seventh and eighth rib fractures with contusion of the adjacent left lung.   Original Report Authenticated By: Signa Kell, M.D.    Ct Head Wo Contrast  06/02/2012  *RADIOLOGY  REPORT*  Clinical Data: Weakness post fall.  CT HEAD WITHOUT CONTRAST  Technique:  Contiguous axial images were obtained from the base of the skull through the vertex without contrast.  Comparison: 10/25/2009  Findings: Atherosclerotic and physiologic intracranial calcifications. White matter hypoattenuation and mild encephalomalacia throughout the right MCA distribution as before, with some associated ex vacuo dilatation of the adjacent right lateral ventricle.  Elsewhere mild parenchymal atrophy. Patchy areas of hypoattenuation in deep and periventricular white matter bilaterally. Negative for acute intracranial hemorrhage, mass lesion, acute infarction, midline shift, or mass-effect. Acute infarct may be inapparent on noncontrast CT. No hydrocephalus. Bone windows demonstrate no focal lesion.  IMPRESSION:  1. Negative for bleed or other acute intracranial process.  2. Atrophy and nonspecific white matter changes. 3.  Stable right MCA distribution encephalomalacia.   Original Report Authenticated By: D. Andria Rhein, MD    Ct Angio Chest Pe W/cm &/or Wo Cm  06/02/2012  *RADIOLOGY REPORT*  Clinical Data: Chest pain  CT ANGIOGRAPHY CHEST  Technique:  Multidetector CT imaging of the chest using the standard protocol during bolus administration of intravenous contrast. Multiplanar reconstructed images including MIPs were obtained and reviewed to evaluate the vascular anatomy.  Contrast: 80mL OMNIPAQUE IOHEXOL 350 MG/ML SOLN  Comparison: 02/13/2011  Findings: Lungs/pleura: There is no pleural effusion identified. Within the left upper lobe there is a suspicious nodule measuring 2.5 x 2.2 cm, image 41/series 5.  Moderate changes of emphysema. No pulmonary contusion identified.  Heart/Mediastinum: Mild cardiac enlargement.  Coronary artery calcifications involve the RCA, LAD and left circumflex coronary arteries.  No pericardial effusion.  There is no enlarged mediastinal or hilar lymph nodes.  The pulmonary artery  appears patent.  There is no lobe bar or segmental pulmonary arterial embolus identified.  Upper abdomen: The adrenal glands both appear normal. Suspicious appearing cyst is noted arising from the upper pole of the left kidney measuring 4.9 cm.  Bones/Musculoskeletal:  There is mild multilevel spondylosis within the thoracic spine.  Acute left sixth and seventh rib fractures are noted.  No aggressive lytic or sclerotic bone lesions identified.  IMPRESSION:  1.  No evidence for acute pulmonary embolus. 2.  There is a nodule in the left upper lobe which has increased in size from previous exam.  This is concerning for primary bronchogenic carcinoma. 3.  Suspicious nodule within the upper pole the left kidney is only partially visualized.  Advise further evaluation with non emergent cross-sectional imaging through the kidneys. In this patient who has a pacemaker the study of choice would be a pre and  post contrast CT of the kidneys.   Original Report Authenticated By: Signa Kell, M.D.     Scheduled Meds: . amiodarone  200 mg Oral Daily  . amLODipine  5 mg Oral Daily  . benzonatate  100 mg Oral TID  . cholecalciferol  1,000 Units Oral Daily  . colesevelam  3,750 mg Oral Q breakfast  . doxazosin  2 mg Oral QHS  . ezetimibe  10 mg Oral Daily  . finasteride  5 mg Oral Daily  . furosemide  40 mg Oral BID  . hydrALAZINE  25 mg Oral TID  . insulin aspart  0-5 Units Subcutaneous QHS  . insulin aspart  0-9 Units Subcutaneous TID WC  . levalbuterol  0.63 mg Nebulization Q6H  . niacin  500 mg Oral QHS  . pantoprazole  40 mg Oral Daily  . potassium chloride SA  20 mEq Oral Daily  . ramipril  10 mg Oral BID  . traZODone  50 mg Oral QHS   Continuous Infusions: . 0.9 % NaCl with KCl 20 mEq / L 70 mL/hr at 06/02/12 2010    Principal Problem:   Fall against object Active Problems:   Multiple closed fractures of ribs of right side   Nodule of left lung   Left renal mass   Chronic obstructive pulmonary  disease with bronchospasm   Hemiparesis affecting left side as late effect of cerebrovascular accident   Tachy-brady syndrome   Cardiac pacemaker   Diabetes mellitus, type 2   Morbid obesity   Hypertension   Chronic systolic CHF (congestive heart failure)    Time spent: > 35 minutes    Penny Pia  [facility] Pager [phone]. If 7PM-7AM, please contact night-coverage at [email or web address], password [email or web address] 06/03/2012, 1:00 PM  LOS: 1 day

## 2012-06-03 NOTE — Progress Notes (Signed)
OT Cancellation Note  Patient Details Name: [name] MRN: [id number] DOB: Sep 05, 1928   Cancelled Treatment:    Reason Eval/Treat Not Completed: Patient at procedure or test/ unavailable. Pt currently off floor and OT to reattempt  Lucile Shutters Pager: [phone]  06/03/2012, 11:34 AM

## 2012-06-03 NOTE — Evaluation (Signed)
Physical Therapy Evaluation Patient Details Name: [name] MRN: [id number] DOB: [birth year] Today's Date: 06/03/2012 Time: 4098-1191 PT Time Calculation (min): 15 min  PT Assessment / Plan / Recommendation Clinical Impression  Pt adm after fall at home and suffering multiple rib fx's on lt.  During treatment pt dyspneic and wheezing (SaO2 92% with pt on 2 L).  Pt scheduled for CT so did not get to a chair but did stand.  Needs skilled PT to maximize I and safety to decr burden of care so pt can eventually return home with wife.  Feel pt needs ST-SNF prior to returning home.    PT Assessment  Patient needs continued PT services    Follow Up Recommendations  SNF    Does the patient have the potential to tolerate intense rehabilitation      Barriers to Discharge        Equipment Recommendations  None recommended by PT    Recommendations for Other Services     Frequency Min 3X/week    Precautions / Restrictions Precautions Precautions: Fall Restrictions Weight Bearing Restrictions: No   Pertinent Vitals/Pain Pt dyspneic and wheezing.  SaO2 92% on 2L of O2.      Mobility  Bed Mobility Bed Mobility: Supine to Sit;Sitting - Scoot to Edge of Bed;Sit to Supine Supine to Sit: 1: +2 Total assist Supine to Sit: Patient Percentage: 40% Sitting - Scoot to Edge of Bed: 1: +2 Total assist Sitting - Scoot to Edge of Bed: Patient Percentage: 40% Sit to Supine: 1: +2 Total assist Sit to Supine: Patient Percentage: 30% Details for Bed Mobility Assistance: Assist to bring trunk up and hips to EOB when coming to sit.  Assist to control trunk going down and bring legs back up to bed when returning to supine. Transfers Transfers: Sit to Stand;Stand to Sit Sit to Stand: 1: +2 Total assist;From bed;With upper extremity assist Sit to Stand: Patient Percentage: 60% Stand to Sit: 1: +2 Total assist Stand to Sit: Patient Percentage: 60% Details for Transfer Assistance: Assist to bring  hips and trunk up into standing.   Ambulation/Gait Ambulation/Gait Assistance: Not tested (comment)    Exercises     PT Diagnosis: Difficulty walking;Acute pain;Abnormality of gait;Hemiplegia non-dominant side  PT Problem List: Decreased strength;Decreased activity tolerance;Decreased balance;Decreased mobility;Pain PT Treatment Interventions: DME instruction;Gait training;Patient/family education;Functional mobility training;Therapeutic activities;Therapeutic exercise;Balance training   PT Goals Acute Rehab PT Goals PT Goal Formulation: With patient Time For Goal Achievement: 06/17/12 Potential to Achieve Goals: Fair Pt will go Supine/Side to Sit: with min assist PT Goal: Supine/Side to Sit - Progress: Goal set today Pt will go Sit to Supine/Side: with min assist PT Goal: Sit to Supine/Side - Progress: Goal set today Pt will go Sit to Stand: with min assist PT Goal: Sit to Stand - Progress: Goal set today Pt will go Stand to Sit: with min assist PT Goal: Stand to Sit - Progress: Goal set today Pt will Transfer Bed to Chair/Chair to Bed: with min assist PT Transfer Goal: Bed to Chair/Chair to Bed - Progress: Goal set today Pt will Ambulate: 1 - 15 feet;with mod assist;with least restrictive assistive device PT Goal: Ambulate - Progress: Goal set today  Visit Information  Last PT Received On: 06/03/12 Assistance Needed: +2    Subjective Data  Subjective: Pt states he walks with a hemiwalker at home Patient Stated Goal: Return home   Prior Functioning  Home Living Lives With: Spouse Available Help at Discharge: Family Type  of Home: House Home Access: Ramped entrance Home Layout: One level Home Adaptive Equipment: Bedside commode/3-in-1;Wheelchair - manual;Other (comment) (hemiwalker) Prior Function Level of Independence: Needs assistance Needs Assistance: Gait;Transfers Gait Assistance: amb short distances with hemiwalker with mod independent but with history of  falls. Transfer Assistance: mod independent but history of falls. 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Surveyor, mining Overall Cognitive Status: Appears within functional limits for tasks assessed/performed Arousal/Alertness: Awake/alert Behavior During Session: [facility] for tasks performed    Extremity/Trunk Assessment Right Lower Extremity Assessment RLE ROM/Strength/Tone: [facility] for tasks assessed Left Lower Extremity Assessment LLE ROM/Strength/Tone: Deficits LLE ROM/Strength/Tone Deficits: Needs assist to move against gravity.   Balance Balance Balance Assessed: Yes Static Sitting Balance Static Sitting - Balance Support: Right upper extremity supported Static Sitting - Level of Assistance: 5: Stand by assistance Static Sitting - Comment/# of Minutes: Sat 8-9 minutes with supervision Static Standing Balance Static Standing - Balance Support: Bilateral upper extremity supported Static Standing - Level of Assistance: 1: +2 Total assist (pt=70%) Static Standing - Comment/# of Minutes: Pt stood x 45 secs with head down.  Decr wt placed on LLE.  End of Session PT - End of Session Activity Tolerance: Other (comment) (SOB and wheezing) Patient left: in bed;with call bell/phone within reach  GP     [facility] 06/03/2012, 10:54 AM  [facility] PT [phone]

## 2012-06-03 NOTE — Progress Notes (Signed)
Referral received today for SNF placement. Met with pt to discuss continued rehab when the pt is ready for d/c. CSW gave pt a list of the local SNF's. I asked the pt's permission to speak with their caregiver. FL2 will be done tomorrow. Full assessment to follow.   Sherald Barge, LCSW-A Clinical Social Worker [phone]

## 2012-06-04 DIAGNOSIS — N289 Disorder of kidney and ureter, unspecified: Secondary | ICD-10-CM

## 2012-06-04 DIAGNOSIS — I10 Essential (primary) hypertension: Secondary | ICD-10-CM

## 2012-06-04 LAB — GLUCOSE, CAPILLARY: Glucose-Capillary: 130 mg/dL — ABNORMAL HIGH (ref 70–99)

## 2012-06-04 MED ORDER — LEVALBUTEROL HCL 0.63 MG/3ML IN NEBU
0.6300 mg | INHALATION_SOLUTION | Freq: Three times a day (TID) | RESPIRATORY_TRACT | Status: DC
  Administered 2012-06-04 – 2012-06-05 (×5): 0.63 mg via RESPIRATORY_TRACT
  Filled 2012-06-04 (×9): qty 3

## 2012-06-04 NOTE — Progress Notes (Signed)
[facility] PROGRESS NOTE  [name] ZOX:[id number] DOB: 01-May-1928 DOA: 06/02/2012 PCP: Ezequiel Kayser, MD  Assessment/Plan: 1. Fall with rib fractures - PT recommending SNF. Placed social work consult for snf placement.  Social worker is currently aware and choice will be provided. - treat symptomatically  2. LUL nodule/nodule at pole of left upper pole of left kidney - At this point radiologist would like to have patient obtain PET scan as outpatient.  Currently patient has broken ribs overlying spot on lung where the specialist would like to biopsy.  Therefore he is recommending that the patient return after his ribs have healed some. - Plan will be to discharge patient to SNF with plans on having PET scan and returning for biopsy to be done by the radiologist. - CT scan of abdomen and pelvis.  3. HTN - continue current regimen and continue to monitor.  - likely exacerbated due to pain from rib fractures  4. DM -  Continue sliding scale insulin -  Blood sugars relatively well controlled.  5. CHF - On lasix 40 mg po bid - continue ramipril - saline lock at this point.   Code Status: full Family Communication: discussed with wife on phone Disposition Plan: To SNF with further work up planned.    Consultants:  IR for biopsy  Procedures:  none  Antibiotics:  None  HPI/Subjective: Patient mentions that pain is tolerable. No new complaints   Objective: Filed Vitals:   06/03/12 2100 06/04/12 0534 06/04/12 1158 06/04/12 1435  BP: 154/65 141/58  112/72  Pulse: 61 60  60  Temp: 99.6 F (37.6 C) 98.7 F (37.1 C)  99.5 F (37.5 C)  TempSrc: Oral Oral  Oral  Resp: 18 18  18   SpO2: 98% 95% 96% 94%   No intake or output data in the 24 hours ending 06/04/12 1455 There were no vitals filed for this visit.  Exam:   General:  Pt in NAD, Alert and Awake  Cardiovascular: S1 and S2, no rubs  Respiratory: no increased work of breathing, no  wheezes  Abdomen: soft, NT, ND  Musculoskeletal: no cyanosis or clubbing.   Data Reviewed: Basic Metabolic Panel:  Recent Labs Lab 06/02/12 1031 06/03/12 0500  NA 138 140  K 4.1 4.2  CL 101 105  CO2 27 27  GLUCOSE 128* 122*  BUN 18 15  CREATININE 1.11 0.94  CALCIUM 9.5 9.1   Liver Function Tests:  Recent Labs Lab 06/02/12 1031 06/03/12 0500  AST 20 16  ALT 11 9  ALKPHOS 58 54  BILITOT 0.4 0.6  PROT 7.5 7.0  ALBUMIN 4.0 3.7   No results found for this basename: LIPASE, AMYLASE,  in the last 168 hours No results found for this basename: AMMONIA,  in the last 168 hours CBC:  Recent Labs Lab 06/02/12 1031 06/03/12 0500  WBC 7.3 7.8  NEUTROABS 5.1  --   HGB 13.5 14.3  HCT 39.6 40.7  MCV 92.1 92.3  PLT 113* 93*   Cardiac Enzymes: No results found for this basename: CKTOTAL, CKMB, CKMBINDEX, TROPONINI,  in the last 168 hours BNP (last 3 results)  Recent Labs  06/02/12 1031  PROBNP 279.3   CBG:  Recent Labs Lab 06/03/12 0737 06/03/12 1705 06/03/12 2222 06/04/12 0738 06/04/12 1106  GLUCAP 109* 121* 123* 127* 130*    No results found for this or any previous visit (from the past 240 hour(s)).   Studies: Ct Pelvis W Contrast  06/03/2012  *RADIOLOGY  REPORT*  Clinical Data:  Assess left renal nodule.  Evaluate for metastatic disease to the bones.  CT ABDOMEN WITHOUT AND WITH CONTRAST CT PELVIS WITH CONTRAST  Technique:  Multidetector CT imaging of the abdomen was performed initially following the standard protocol before administration of intravenous contrast.  Multidetector CT imaging of the abdomen and pelvis was then performed following the standard protocol during the bolus injection of intravenous contrast.  Contrast: OMNIPAQUE IOHEXOL 300 MG/ML  SOLN  Comparison:  CT of the abdomen and pelvis 06/16/2009.  Findings:  Lung Bases: New linear opacities throughout the lung bases bilaterally most compatible with areas of subsegmental atelectasis.  Trace left pleural effusion is new compared to yesterday's examination.  Heart size is mildly enlarged.  Pacemaker wires terminating in the right atrium and right ventricular apex.  A small hiatal hernia.  Calcifications of the aortic valve. Atherosclerotic calcifications of the left anterior descending, left circumflex and right coronary arteries.  Abdomen/Pelvis:  There are two lesions in the left kidney, both of which are low attenuation, smoothly marginated without evidence of internal enhancement, compatible with simple cysts, the largest of which extends exophytically from the upper pole of the left kidney measuring 5.3 x 5.5 cm.  No definite enhancing left renal lesion is otherwise noted.  There is a small amount of cortical thinning in the medial aspect of the upper pole of the right kidney, most compatible with scarring.  The right kidney is otherwise unremarkable in appearance.  High attenuation material filling the lumen of the gallbladder likely represents vicarious excretion of contrast material from yesterday's CT examination.  No signs of acute cholecystitis.  The appearance of the liver, pancreas and bilateral adrenal glands is unremarkable.  The spleen is not visualized and is presumably surgically absent (no surgical clips are noted in the left upper quadrant).  Normal appendix.  No significant volume of ascites, no pneumoperitoneum and no pathologic distension of small bowel. Extensive atherosclerosis throughout the abdominal and pelvic vasculature, including a fusiform infrarenal abdominal aortic aneurysm which measures approximately 4.0 x 3.8 cm.  This aneurysm contains a large burden of eccentric nonenhancing material, most compatible with thrombus.  There is also mild aneurysmal dilatation of the common iliac arteries bilaterally (both are 16 mm in diameter).  An IVC filter is in position with the tip terminating at the level of the left renal vein.  Prostate gland is mildly heterogeneous, but  otherwise unremarkable in appearance.  Urinary bladder is unremarkable.  Musculoskeletal: Advanced degenerative disc disease at L4-L5 with complete loss of the intervertebral disc height and discogenic changes of the adjacent vertebral body endplate. There are no aggressive appearing lytic or blastic lesions noted in the visualized portions of the skeleton.  Old healed left sided posterior rib fractures are incidentally noted.  IMPRESSION: 1.  There are two lesions in the left kidney, both of which are compatible with simple cysts.  No suspicious renal lesions are identified on today's examination. 2. Atherosclerosis, including at least three vessel coronary artery disease.  In addition, there is a 4.0 x 3.8 cm fusiform infrarenal abdominal aortic aneurysm, and and there are small bilateral common iliac artery aneurysms, as above. 3. Additional incidental findings, as above.   Original Report Authenticated By: Trudie Reed, M.D.    Ct Abd Wo & W Cm  06/03/2012  *RADIOLOGY REPORT*  Clinical Data:  Assess left renal nodule.  Evaluate for metastatic disease to the bones.  CT ABDOMEN WITHOUT AND WITH CONTRAST CT PELVIS  WITH CONTRAST  Technique:  Multidetector CT imaging of the abdomen was performed initially following the standard protocol before administration of intravenous contrast.  Multidetector CT imaging of the abdomen and pelvis was then performed following the standard protocol during the bolus injection of intravenous contrast.  Contrast: OMNIPAQUE IOHEXOL 300 MG/ML  SOLN  Comparison:  CT of the abdomen and pelvis 06/16/2009.  Findings:  Lung Bases: New linear opacities throughout the lung bases bilaterally most compatible with areas of subsegmental atelectasis. Trace left pleural effusion is new compared to yesterday's examination.  Heart size is mildly enlarged.  Pacemaker wires terminating in the right atrium and right ventricular apex.  A small hiatal hernia.  Calcifications of the aortic valve.  Atherosclerotic calcifications of the left anterior descending, left circumflex and right coronary arteries.  Abdomen/Pelvis:  There are two lesions in the left kidney, both of which are low attenuation, smoothly marginated without evidence of internal enhancement, compatible with simple cysts, the largest of which extends exophytically from the upper pole of the left kidney measuring 5.3 x 5.5 cm.  No definite enhancing left renal lesion is otherwise noted.  There is a small amount of cortical thinning in the medial aspect of the upper pole of the right kidney, most compatible with scarring.  The right kidney is otherwise unremarkable in appearance.  High attenuation material filling the lumen of the gallbladder likely represents vicarious excretion of contrast material from yesterday's CT examination.  No signs of acute cholecystitis.  The appearance of the liver, pancreas and bilateral adrenal glands is unremarkable.  The spleen is not visualized and is presumably surgically absent (no surgical clips are noted in the left upper quadrant).  Normal appendix.  No significant volume of ascites, no pneumoperitoneum and no pathologic distension of small bowel. Extensive atherosclerosis throughout the abdominal and pelvic vasculature, including a fusiform infrarenal abdominal aortic aneurysm which measures approximately 4.0 x 3.8 cm.  This aneurysm contains a large burden of eccentric nonenhancing material, most compatible with thrombus.  There is also mild aneurysmal dilatation of the common iliac arteries bilaterally (both are 16 mm in diameter).  An IVC filter is in position with the tip terminating at the level of the left renal vein.  Prostate gland is mildly heterogeneous, but otherwise unremarkable in appearance.  Urinary bladder is unremarkable.  Musculoskeletal: Advanced degenerative disc disease at L4-L5 with complete loss of the intervertebral disc height and discogenic changes of the adjacent vertebral body  endplate. There are no aggressive appearing lytic or blastic lesions noted in the visualized portions of the skeleton.  Old healed left sided posterior rib fractures are incidentally noted.  IMPRESSION: 1.  There are two lesions in the left kidney, both of which are compatible with simple cysts.  No suspicious renal lesions are identified on today's examination. 2. Atherosclerosis, including at least three vessel coronary artery disease.  In addition, there is a 4.0 x 3.8 cm fusiform infrarenal abdominal aortic aneurysm, and and there are small bilateral common iliac artery aneurysms, as above. 3. Additional incidental findings, as above.   Original Report Authenticated By: Trudie Reed, M.D.     Scheduled Meds: . amiodarone  200 mg Oral Daily  . amLODipine  5 mg Oral Daily  . benzonatate  100 mg Oral TID  . cholecalciferol  1,000 Units Oral Daily  . colesevelam  3,750 mg Oral Q breakfast  . doxazosin  2 mg Oral QHS  . ezetimibe  10 mg Oral Daily  . finasteride  5 mg Oral Daily  . furosemide  40 mg Oral BID  . hydrALAZINE  25 mg Oral TID  . insulin aspart  0-5 Units Subcutaneous QHS  . insulin aspart  0-9 Units Subcutaneous TID WC  . levalbuterol  0.63 mg Nebulization TID  . niacin  500 mg Oral QHS  . pantoprazole  40 mg Oral Daily  . potassium chloride SA  20 mEq Oral Daily  . ramipril  10 mg Oral BID  . traZODone  50 mg Oral QHS   Continuous Infusions:    Principal Problem:   Fall against object Active Problems:   Multiple closed fractures of ribs of right side   Nodule of left lung   Left renal mass   Chronic obstructive pulmonary disease with bronchospasm   Hemiparesis affecting left side as late effect of cerebrovascular accident   Tachy-brady syndrome   Cardiac pacemaker   Diabetes mellitus, type 2   Morbid obesity   Hypertension   Chronic systolic CHF (congestive heart failure)    Time spent: > 35 minutes    Penny Pia  [facility] Pager [phone].  If 7PM-7AM, please contact night-coverage at [email or web address], password [facility] 06/04/2012, 2:55 PM  LOS: 2 days

## 2012-06-04 NOTE — Evaluation (Signed)
Occupational Therapy Evaluation Patient Details Name: [name] MRN: [id number] DOB: 22-Oct-1928 Today's Date: 06/04/2012 Time: 4098-1191 OT Time Calculation (min): 27 min  OT Assessment / Plan / Recommendation Clinical Impression  79 yo male admitted s/p fall with Lt rib fx that has a hx of CVA LT hemiplegia. Pt could benefit from skilled OT acutely. Recommend SNf for d/c planning    OT Assessment  Patient needs continued OT Services    Follow Up Recommendations  SNF    Barriers to Discharge Other (comment) (elderly wife that can not provided more than MIN guard (A))    Equipment Recommendations  3 in 1 bedside comode;Wheelchair (measurements OT);Wheelchair cushion (measurements OT)    Recommendations for Other Services    Frequency  Min 2X/week    Precautions / Restrictions Precautions Precautions: Fall Precaution Comments: high risk for skin break down   Pertinent Vitals/Pain None at this time    ADL  Toilet Transfer: +2 Total assistance Toilet Transfer: Patient Percentage: 50% Toilet Transfer Method: Sit to stand Toilet Transfer Equipment: Raised toilet seat with arms (or 3-in-1 over toilet) Equipment Used: Gait belt Transfers/Ambulation Related to ADLs: Pt completed stand pivot to the right side due to left LE deficits. pt needed facilitation for upright posture . pt with ataxic movements ADL Comments: pt supine and agreeable to OOB. pt c/o rib discomfort and reports requesting pain medication. Pt tolerated entire session without c/o pain. pt log roll to right due to left rib fx and transfered to chair.     OT Diagnosis: Generalized weakness;Hemiplegia non-dominant side;Ataxia  OT Problem List: Decreased strength;Decreased activity tolerance;Impaired balance (sitting and/or standing);Decreased safety awareness;Decreased knowledge of use of DME or AE;Decreased knowledge of precautions;Impaired UE functional use OT Treatment Interventions: Self-care/ADL training;DME  and/or AE instruction;Therapeutic activities;Patient/family education;Balance training;Neuromuscular education   OT Goals Acute Rehab OT Goals OT Goal Formulation: With patient/family Time For Goal Achievement: 06/18/12 Potential to Achieve Goals: Good ADL Goals Pt Will Perform Grooming: with set-up;Sitting, chair;Supported ADL Goal: Grooming - Progress: Goal set today Pt Will Transfer to Toilet: with max assist;Stand pivot transfer;3-in-1 ADL Goal: Statistician - Progress: Goal set today Pt Will Perform Toileting - Hygiene: with max assist;Sit to stand from 3-in-1/toilet ADL Goal: Toileting - Hygiene - Progress: Goal set today Miscellaneous OT Goals Miscellaneous OT Goal #1: pt will complete bed mobility Mod (A) rolling Rt side as precursor to adls OT Goal: Miscellaneous Goal #1 - Progress: Goal set today  Visit Information  Last OT Received On: 06/04/12 Assistance Needed: +2    Subjective Data  Subjective: "you will need to help me with that one"- pt educating therapist on Lt LE deficits Patient Stated Goal: to return [name] however patient's wife wants him at [name] home since it is closer to their home    Prior Functioning     Home Living Lives With: Spouse Available Help at Discharge: Family Type of Home: House Home Access: Ramped entrance Home Layout: One level Bathroom Shower/Tub: Other (comment) (sponge bath) Bathroom Toilet:  (3n1) Home Adaptive Equipment: Bedside commode/3-in-1;Wheelchair - manual;Other (comment) Prior Function Level of Independence: Needs assistance Needs Assistance: Gait;Transfers Gait Assistance: amb short distances with hemiwalker with mod independent but with history of falls. Transfer Assistance: mod independent but history of falls. 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[employer] Dominant Hand: Right         Vision/Perception Vision - Assessment Vision Assessment: Vision not  tested   [employer] Overall  Cognitive Status: Appears within functional limits for tasks assessed/performed Arousal/Alertness: Awake/alert Orientation Level: Appears intact for tasks assessed Behavior During Session: [facility] for tasks performed Cognition - Other Comments: HOH - had to repeat questions but answered correctly when question was understood    Extremity/Trunk Assessment Right Upper Extremity Assessment RUE ROM/Strength/Tone: Deficits RUE ROM/Strength/Tone Deficits: 3 out 5 RUE Coordination: WFL - gross motor Left Upper Extremity Assessment LUE ROM/Strength/Tone: Deficits LUE ROM/Strength/Tone Deficits: 3- out 5 shoulder flexion,  LUE Coordination: Deficits (2 out 5 grasp Brunstrom II) Trunk Assessment Trunk Assessment: Kyphotic     Mobility Bed Mobility Supine to Sit: 1: +2 Total assist;HOB elevated;With rails Supine to Sit: Patient Percentage: 40% Sitting - Scoot to Edge of Bed: 1: +2 Total assist Sitting - Scoot to Edge of Bed: Patient Percentage: 40% Details for Bed Mobility Assistance: Pt needed total (A) with Lt LE.[name] Pt able to push up with RT UE to (A) with supine <>sit .  Transfers Sit to Stand: 1: +2 Total assist;With upper extremity assist;From chair/3-in-1 Sit to Stand: Patient Percentage: 50% Stand to Sit: 1: +2 Total assist;With upper extremity assist;To chair/3-in-1 Stand to Sit: Patient Percentage: 50% Details for Transfer Assistance: pt with ataxic movement for sit<>stand. pt required therapist (A) for static standing and weight shift to place BIL LE in correct alignment. pt side stepping with Rt LE. Pt with therapist weight shifting to help with advancement. Pt required blocking of Rt LE      Exercise     Balance Static Sitting Balance Static Sitting - Balance Support: Bilateral upper extremity supported;Feet supported Static Sitting - Level of Assistance: 5: Stand by assistance Static Sitting - Comment/# of Minutes: ~3 minutes   End of  Session OT - End of Session Activity Tolerance: Patient tolerated treatment well Patient left: in chair;with call bell/phone within reach Nurse Communication: Mobility status;Need for lift equipment;Precautions  GO     Lucile Shutters 06/04/2012, 3:52 PM Pager: [phone]

## 2012-06-05 LAB — GLUCOSE, CAPILLARY
Glucose-Capillary: 127 mg/dL — ABNORMAL HIGH (ref 70–99)
Glucose-Capillary: 141 mg/dL — ABNORMAL HIGH (ref 70–99)

## 2012-06-05 MED ORDER — BENZONATATE 100 MG PO CAPS: 100.0000 mg | ORAL_CAPSULE | Freq: Three times a day (TID) | ORAL | Status: DC

## 2012-06-05 MED ORDER — HYDROCODONE-ACETAMINOPHEN 5-325 MG PO TABS: 1.0000 | ORAL_TABLET | ORAL | Status: DC | PRN

## 2012-06-05 MED ORDER — INSULIN ASPART 100 UNIT/ML ~~LOC~~ SOLN: SUBCUTANEOUS | Status: DC

## 2012-06-05 NOTE — Progress Notes (Signed)
Physical Therapy Treatment Patient Details Name: [name] MRN: [id number] DOB: [birth year] Today's Date: 06/05/2012 Time: 4098-1191 PT Time Calculation (min): 45 min  PT Assessment / Plan / Recommendation Comments on Treatment Session  Pt had difficulty with stand pivot transfer. May try slide board next session.      Follow Up Recommendations  SNF     Does the patient have the potential to tolerate intense rehabilitation     Barriers to Discharge        Equipment Recommendations  None recommended by PT    Recommendations for Other Services    Frequency Min 3X/week   Plan Discharge plan remains appropriate;Frequency remains appropriate    Precautions / Restrictions Precautions Precautions: Fall Precaution Comments: high risk for skin break down Restrictions Weight Bearing Restrictions: No   Pertinent Vitals/Pain C/o L rib pain 4-5/10.  RN to provide pain medication at completion of PT session.     Mobility  Bed Mobility Bed Mobility: Supine to Sit;Sitting - Scoot to Edge of Bed Supine to Sit: 3: Mod assist;HOB elevated Supine to Sit: Patient Percentage: 60% Sitting - Scoot to Edge of Bed: 3: Mod assist;With rail Sitting - Scoot to [place] of Bed: Patient Percentage: 60% Sit to Supine: Not Tested (comment) Details for Bed Mobility Assistance: Assist for LLE, assist to raise trunk from bed and scoot hips to EOB using lift pad.   Transfers Transfers: Sit to Stand;Stand to Sit;Stand Pivot Transfers Sit to Stand: 1: +2 Total assist;With upper extremity assist;From chair/3-in-1 (4 trials) Sit to Stand: Patient Percentage: 60% Stand to Sit: 1: +2 Total assist;With upper extremity assist;To chair/3-in-1 (4 trials) Stand to Sit: Patient Percentage: 60% Stand Pivot Transfers: 1: +2 Total assist Stand Pivot Transfers: Patient Percentage: 20% Details for Transfer Assistance: Assist to bring hips and trunk into standing. Pt unable to achieve fully extended trunk/hip position.   Pt able to stand for appoximately 10-15 seconds 3 trials.   Attaxic movements continue. Pt stood with R hemi walker.  Pt required +2 assist to stand pivot with HHA from high bed to chair.  Pt began to sit (flex hips and knees) at initiation of pivot.   Less assistance required today.   Ambulation/Gait Ambulation/Gait Assistance: Not tested (comment)    Exercises Total Joint Exercises Long Arc Quad: 10 reps;Seated;Right   PT Diagnosis:    PT Problem List:   PT Treatment Interventions:     PT Goals Acute Rehab PT Goals PT Goal Formulation: With patient Time For Goal Achievement: 06/17/12 Potential to Achieve Goals: Fair Pt will go Supine/Side to Sit: with min assist PT Goal: Supine/Side to Sit - Progress: Progressing toward goal Pt will go Sit to Supine/Side: with min assist PT Goal: Sit to Supine/Side - Progress: Progressing toward goal Pt will go Sit to Stand: with min assist PT Goal: Sit to Stand - Progress: Progressing toward goal Pt will go Stand to Sit: with min assist PT Goal: Stand to Sit - Progress: Progressing toward goal Pt will Transfer Bed to Chair/Chair to Bed: with min assist PT Transfer Goal: Bed to Chair/Chair to Bed - Progress: Progressing toward goal Pt will Ambulate: 1 - 15 feet;with mod assist;with least restrictive assistive device  Visit Information  Last PT Received On: 06/05/12    Subjective Data      Cognition  Cognition Overall Cognitive Status: Appears within functional limits for tasks assessed/performed Arousal/Alertness: Awake/alert Orientation Level: Appears intact for tasks assessed Behavior During Session: [facility] for tasks performed Cognition -  Other Comments: HOH - had to repeat questions but answered correctly when question was understood    Balance  Balance Balance Assessed: Yes Static Sitting Balance Static Sitting - Balance Support: Bilateral upper extremity supported;Feet supported Static Sitting - Level of Assistance: 5: Stand by  assistance Static Sitting - Comment/# of Minutes: 5+ minutes sitting on EOB with no LOB.   Static Standing Balance Static Standing - Balance Support: Right upper extremity supported Static Standing - Level of Assistance: 3: Mod assist Static Standing - Comment/# of Minutes: 15 seconds with no physical assistance, 40 seconds with min-mod assist.   End of Session PT - End of Session Equipment Utilized During Treatment: Gait belt Activity Tolerance: Patient tolerated treatment well;Patient limited by fatigue (pt presents with audible wheezing.  ) Patient left: in chair;with call bell/phone within reach;with chair alarm set;with family/visitor present Nurse Communication: Mobility status;Need for lift equipment   GP     [name] 06/05/2012, 10:54 AM Morning Halberg L. Tashaun Obey DPT [phone]

## 2012-06-05 NOTE — Progress Notes (Signed)
Pt provided with dc isntructions and education. Wife updated. Copy of AVS placed in [facility] to be transported with patient to SNF. IV removed with tip intact. Heart monitor cleaned and returned to front. Pt awaiting PTAR. Ilona Sorrel, RN

## 2012-06-05 NOTE — Discharge Summary (Signed)
Physician Discharge Summary  [name] ZOX:[id number] DOB: 02-11-1929 DOA: 06/02/2012  PCP: Ezequiel Kayser, MD  Admit date: 06/02/2012 Discharge date: 06/05/2012  Time spent: 40 minutes  Recommendations for Outpatient Follow-up:  1. Follow up with PMD.  2. PMD to arrange outpatient PET Scan/Imaging-Guided biopsy of Lung nodule/mass.    Discharge Diagnoses:  Principal Problem:   Fall against object Active Problems:   Multiple closed fractures of ribs of right side   Nodule of left lung   Left renal mass   Chronic obstructive pulmonary disease with bronchospasm   Hemiparesis affecting left side as late effect of cerebrovascular accident   Tachy-brady syndrome   Cardiac pacemaker   Diabetes mellitus, type 2   Morbid obesity   Hypertension   Chronic systolic CHF (congestive heart failure)   Discharge Condition: Satisfactory.   Diet recommendation: Heart-Healthy.   Filed Weights   06/04/12 1615 06/05/12 0500  Weight: 98.7 kg (217 lb 9.5 oz) 94.5 kg (208 lb 5.4 oz)    History of present illness:  79 y.o. male with a history significant for diabetes mellitus, tachycardia-bradycardia syndrome, status post pacemaker, frequent falls, morbid obesity, and left-sided hemiparesis from a previous stroke, who presents with inability to get up after a fall. He generally ambulates with a walker, secondary to left-sided hemiparesis from a stroke many years ago. In the evening of 05/30/12, he stumbled and fell on his left side, apparently hitting a table. He did not loose consciousness. There was no head injury. He developed sharp left-sided chest pain. His wife was unable to assist him up. She called EMS who assisted him back to the wheelchair. Later that evening, he was able to ambulate in the wheelchair to go back to bed. However, in morning of 06/02/12, when he was trying to use the bedside commode, he was unable to get out of the wheelchair to sit on the commode, primarily because of left-sided  chest wall pain. He described his pain as sharp and constant, worse on deep inspiration. He has had no recent substernal chest pain. He has chronic shortness of breath. Over the past few weeks, he has had a congested cough but he has been unable to expectorate. He has had some intermittent wheezing. He stopped smoking more than 20 years ago.  In the emergency department, he is afebrile and hemodynamically stable, though he is hypertensive. His lab data are significant for a platelet count of 113, but no other significant abnormalities. His EKG revealed atrial fibrillation with frequent PVCs and a heart rate of 74 beats per minute. His troponin I is negative.  He was admitted for further evaluation and management.      Hospital Course: 1. Fall with rib fractures: Patient presented following a mechanical fall, complicated by rib fractures. At the time of presentation, CTA, done for elevated D-Dimer of 13.16, revealed no acute pulmonary embolus. Rib x-rays revealed acute left seventh and eighth rib fractures with contusion of the adjacent left lung (no contusion seen on CT of the chest). Patient was managed with analgesics, with good effect. PT/OT has recommended SNF.  2. LUL nodule/nodule at pole of left upper pole of left kidney: As described above, patient had a CTA on presentation, and in addition to findings described  In #1 above, it also revealed a 2.5 cm nodule/mass in the left upper lobe of the lung concerning for bronchogenic carcinoma and a suspicious nodule within the upper pole of the left kidney. IR has recommended PET scan as outpatient.  Currently patient has broken ribs overlying spot on lung where the specialist would like to biopsy. Therefore he is recommending that the patient return after his ribs have healed. CT abdomen/Pelvis showed two lesions in the left kidney, both of which are  compatible with simple cysts. No suspicious renal lesions were  Identified. Have discussed with Dr  Laurey Morale office on 06/05/12, and he will be arranging PET scan/possible biopsy, and sub-specialty referral, if indicated 3. HTN: This was exacerbated due to pain from rib fractures, but reasonably controlled on pre-admission antihypertensives.  4. DM: Controlled on diet/SSI, during this hospitalization. 5. CHF: Patient had no evidence of acute decompensation, during his hospitalization. He remained stable Lasix and Ramipril.    Procedures:  See Below.   Consultations:  N/A.   Discharge Exam: Filed Vitals:   06/05/12 0500 06/05/12 0832 06/05/12 0937 06/05/12 1348  BP: 154/75  137/85   Pulse: 61 51    Temp: 99.3 F (37.4 C)     TempSrc:      Resp: 18 18    Height:      Weight: 94.5 kg (208 lb 5.4 oz)     SpO2: 91% 94%  94%    General: Comfortable, alert, communicative, not short of breath at rest.  HEENT:  No clinical pallor, no jaundice, no conjunctival injection or discharge. NECK:  Supple, JVP not seen, no carotid bruits, no palpable lymphadenopathy, no palpable goiter. CHEST:  Clinically clear to auscultation, no wheezes, no crackles. HEART:  Sounds 1 and 2 heard, normal, regular, no murmurs. ABDOMEN:  Moderately obese, soft, non-tender, no palpable organomegaly, no palpable masses, normal bowel sounds. GENITALIA:  Not examined. LOWER EXTREMITIES:  No pitting edema, palpable peripheral pulses. MUSCULOSKELETAL SYSTEM:  Generalized osteoarthritic changes, otherwise, normal. CENTRAL NERVOUS SYSTEM:  No focal neurologic deficit on gross examination.  Discharge Instructions      Discharge Orders   Future Orders Complete By Expires     Diet - low sodium heart healthy  As directed     Increase activity slowly  As directed         Medication List    TAKE these medications       acetaminophen 325 MG tablet  Commonly known as:  TYLENOL  Take 650 mg by mouth every 6 (six) hours as needed.     amiodarone 200 MG tablet  Commonly known as:  PACERONE  Take 200 mg by mouth  daily.     amLODipine 5 MG tablet  Commonly known as:  NORVASC  Take 5 mg by mouth daily.     aspirin 81 MG tablet  Take 81 mg by mouth daily.     benzonatate 100 MG capsule  Commonly known as:  TESSALON  Take 1 capsule (100 mg total) by mouth 3 (three) times daily.     calcium-vitamin D 500-200 MG-UNIT per tablet  Commonly known as:  OSCAL WITH D  Take 1 tablet by mouth daily.     doxazosin 2 MG tablet  Commonly known as:  CARDURA  Take 2 mg by mouth at bedtime.     ezetimibe 10 MG tablet  Commonly known as:  ZETIA  Take 10 mg by mouth daily.     finasteride 5 MG tablet  Commonly known as:  PROSCAR  Take 5 mg by mouth daily.     furosemide 40 MG tablet  Commonly known as:  LASIX  Take 40 mg by mouth 2 (two) times daily.     hydrALAZINE  25 MG tablet  Commonly known as:  APRESOLINE  Take 25 mg by mouth 3 (three) times daily.     HYDROcodone-acetaminophen 5-325 MG per tablet  Commonly known as:  NORCO/VICODIN  Take 1 tablet by mouth every 4 (four) hours as needed.     insulin aspart 100 UNIT/ML injection  Commonly known as:  novoLOG  For CBG 70-120=No Insulin; CBG 120-150=1 unit; CBG 151-200=2 units; CBG 201-250=3 units; CBG 251-300=5 units; CBG 301-350=7 units; CBG 351-400=9 units.     niacin 500 MG CR tablet  Commonly known as:  NIASPAN  Take 500 mg by mouth at bedtime.     potassium chloride SA 20 MEQ tablet  Commonly known as:  K-DUR,KLOR-CON  Take 20 mEq by mouth daily.     PROTONIX 40 MG tablet  Generic drug:  pantoprazole  Take 40 mg by mouth daily.     ramipril 10 MG capsule  Commonly known as:  ALTACE  Take 10 mg by mouth 2 (two) times daily.     traZODone 50 MG tablet  Commonly known as:  DESYREL  Take 50 mg by mouth at bedtime.     Vitamin D3 1000 UNITS Caps  Take 1 each by mouth daily.     WELCHOL 3.75 G Pack  Generic drug:  Colesevelam HCl  Take 1 each by mouth daily. One packet with food with largest meal of day       Follow-up  Information   Follow up with Ezequiel Kayser, MD.   Contact information:   [address] [place] [address] [phone]        The results of significant diagnostics from this hospitalization (including imaging, microbiology, ancillary and laboratory) are listed below for reference.    Significant Diagnostic Studies: Dg Ribs Unilateral W/chest Left  06/02/2012  *RADIOLOGY REPORT*  Clinical Data: Status post fall.  Pain.  LEFT RIBS AND CHEST - 3+ VIEW  Comparison: 02/13/2011  Findings: There is a right chest wall pacer device with lead in the right atrial appendage and right ventricle.  Heart size is mildly enlarged.  No pleural effusion or edema.  There is a focal opacity within the left midlung. There are adjacent seventh and eighth left posterior rib fractures.  The right lung appears clear. The patient is known to have a IVC filter.  Chronic healed fracture of the left humerus noted.  IMPRESSION:  1.  Acute left seventh and eighth rib fractures with contusion of the adjacent left lung.   Original Report Authenticated By: Signa Kell, M.D.    Ct Head Wo Contrast  06/02/2012  *RADIOLOGY REPORT*  Clinical Data: Weakness post fall.  CT HEAD WITHOUT CONTRAST  Technique:  Contiguous axial images were obtained from the base of the skull through the vertex without contrast.  Comparison: 10/25/2009  Findings: Atherosclerotic and physiologic intracranial calcifications. White matter hypoattenuation and mild encephalomalacia throughout the right MCA distribution as before, with some associated ex vacuo dilatation of the adjacent right lateral ventricle.  Elsewhere mild parenchymal atrophy. Patchy areas of hypoattenuation in deep and periventricular white matter bilaterally. Negative for acute intracranial hemorrhage, mass lesion, acute infarction, midline shift, or mass-effect. Acute infarct may be inapparent on noncontrast CT. No hydrocephalus. Bone windows demonstrate no focal lesion.  IMPRESSION:  1.  Negative for bleed or other acute intracranial process.  2. Atrophy and nonspecific white matter changes. 3.  Stable right MCA distribution encephalomalacia.   Original Report Authenticated By: D. Andria Rhein, MD  Ct Angio Chest Pe W/cm &/or Wo Cm  06/02/2012  *RADIOLOGY REPORT*  Clinical Data: Chest pain  CT ANGIOGRAPHY CHEST  Technique:  Multidetector CT imaging of the chest using the standard protocol during bolus administration of intravenous contrast. Multiplanar reconstructed images including MIPs were obtained and reviewed to evaluate the vascular anatomy.  Contrast: 80mL OMNIPAQUE IOHEXOL 350 MG/ML SOLN  Comparison: 02/13/2011  Findings: Lungs/pleura: There is no pleural effusion identified. Within the left upper lobe there is a suspicious nodule measuring 2.5 x 2.2 cm, image 41/series 5.  Moderate changes of emphysema. No pulmonary contusion identified.  Heart/Mediastinum: Mild cardiac enlargement.  Coronary artery calcifications involve the RCA, LAD and left circumflex coronary arteries.  No pericardial effusion.  There is no enlarged mediastinal or hilar lymph nodes.  The pulmonary artery appears patent.  There is no lobe bar or segmental pulmonary arterial embolus identified.  Upper abdomen: The adrenal glands both appear normal. Suspicious appearing cyst is noted arising from the upper pole of the left kidney measuring 4.9 cm.  Bones/Musculoskeletal:  There is mild multilevel spondylosis within the thoracic spine.  Acute left sixth and seventh rib fractures are noted.  No aggressive lytic or sclerotic bone lesions identified.  IMPRESSION:  1.  No evidence for acute pulmonary embolus. 2.  There is a nodule in the left upper lobe which has increased in size from previous exam.  This is concerning for primary bronchogenic carcinoma. 3.  Suspicious nodule within the upper pole the left kidney is only partially visualized.  Advise further evaluation with non emergent cross-sectional imaging through the  kidneys. In this patient who has a pacemaker the study of choice would be a pre and post contrast CT of the kidneys.   Original Report Authenticated By: Signa Kell, M.D.    Ct Pelvis W Contrast  06/03/2012  *RADIOLOGY REPORT*  Clinical Data:  Assess left renal nodule.  Evaluate for metastatic disease to the bones.  CT ABDOMEN WITHOUT AND WITH CONTRAST CT PELVIS WITH CONTRAST  Technique:  Multidetector CT imaging of the abdomen was performed initially following the standard protocol before administration of intravenous contrast.  Multidetector CT imaging of the abdomen and pelvis was then performed following the standard protocol during the bolus injection of intravenous contrast.  Contrast: OMNIPAQUE IOHEXOL 300 MG/ML  SOLN  Comparison:  CT of the abdomen and pelvis 06/16/2009.  Findings:  Lung Bases: New linear opacities throughout the lung bases bilaterally most compatible with areas of subsegmental atelectasis. Trace left pleural effusion is new compared to yesterday's examination.  Heart size is mildly enlarged.  Pacemaker wires terminating in the right atrium and right ventricular apex.  A small hiatal hernia.  Calcifications of the aortic valve. Atherosclerotic calcifications of the left anterior descending, left circumflex and right coronary arteries.  Abdomen/Pelvis:  There are two lesions in the left kidney, both of which are low attenuation, smoothly marginated without evidence of internal enhancement, compatible with simple cysts, the largest of which extends exophytically from the upper pole of the left kidney measuring 5.3 x 5.5 cm.  No definite enhancing left renal lesion is otherwise noted.  There is a small amount of cortical thinning in the medial aspect of the upper pole of the right kidney, most compatible with scarring.  The right kidney is otherwise unremarkable in appearance.  High attenuation material filling the lumen of the gallbladder likely represents vicarious excretion of  contrast material from yesterday's CT examination.  No signs of acute cholecystitis.  The appearance of the liver, pancreas and bilateral adrenal glands is unremarkable.  The spleen is not visualized and is presumably surgically absent (no surgical clips are noted in the left upper quadrant).  Normal appendix.  No significant volume of ascites, no pneumoperitoneum and no pathologic distension of small bowel. Extensive atherosclerosis throughout the abdominal and pelvic vasculature, including a fusiform infrarenal abdominal aortic aneurysm which measures approximately 4.0 x 3.8 cm.  This aneurysm contains a large burden of eccentric nonenhancing material, most compatible with thrombus.  There is also mild aneurysmal dilatation of the common iliac arteries bilaterally (both are 16 mm in diameter).  An IVC filter is in position with the tip terminating at the level of the left renal vein.  Prostate gland is mildly heterogeneous, but otherwise unremarkable in appearance.  Urinary bladder is unremarkable.  Musculoskeletal: Advanced degenerative disc disease at L4-L5 with complete loss of the intervertebral disc height and discogenic changes of the adjacent vertebral body endplate. There are no aggressive appearing lytic or blastic lesions noted in the visualized portions of the skeleton.  Old healed left sided posterior rib fractures are incidentally noted.  IMPRESSION: 1.  There are two lesions in the left kidney, both of which are compatible with simple cysts.  No suspicious renal lesions are identified on today's examination. 2. Atherosclerosis, including at least three vessel coronary artery disease.  In addition, there is a 4.0 x 3.8 cm fusiform infrarenal abdominal aortic aneurysm, and and there are small bilateral common iliac artery aneurysms, as above. 3. Additional incidental findings, as above.   Original Report Authenticated By: Trudie Reed, M.D.    Ct Abd Wo & W Cm  06/03/2012  *RADIOLOGY REPORT*   Clinical Data:  Assess left renal nodule.  Evaluate for metastatic disease to the bones.  CT ABDOMEN WITHOUT AND WITH CONTRAST CT PELVIS WITH CONTRAST  Technique:  Multidetector CT imaging of the abdomen was performed initially following the standard protocol before administration of intravenous contrast.  Multidetector CT imaging of the abdomen and pelvis was then performed following the standard protocol during the bolus injection of intravenous contrast.  Contrast: OMNIPAQUE IOHEXOL 300 MG/ML  SOLN  Comparison:  CT of the abdomen and pelvis 06/16/2009.  Findings:  Lung Bases: New linear opacities throughout the lung bases bilaterally most compatible with areas of subsegmental atelectasis. Trace left pleural effusion is new compared to yesterday's examination.  Heart size is mildly enlarged.  Pacemaker wires terminating in the right atrium and right ventricular apex.  A small hiatal hernia.  Calcifications of the aortic valve. Atherosclerotic calcifications of the left anterior descending, left circumflex and right coronary arteries.  Abdomen/Pelvis:  There are two lesions in the left kidney, both of which are low attenuation, smoothly marginated without evidence of internal enhancement, compatible with simple cysts, the largest of which extends exophytically from the upper pole of the left kidney measuring 5.3 x 5.5 cm.  No definite enhancing left renal lesion is otherwise noted.  There is a small amount of cortical thinning in the medial aspect of the upper pole of the right kidney, most compatible with scarring.  The right kidney is otherwise unremarkable in appearance.  High attenuation material filling the lumen of the gallbladder likely represents vicarious excretion of contrast material from yesterday's CT examination.  No signs of acute cholecystitis.  The appearance of the liver, pancreas and bilateral adrenal glands is unremarkable.  The spleen is not visualized and is presumably surgically absent  (no surgical clips  are noted in the left upper quadrant).  Normal appendix.  No significant volume of ascites, no pneumoperitoneum and no pathologic distension of small bowel. Extensive atherosclerosis throughout the abdominal and pelvic vasculature, including a fusiform infrarenal abdominal aortic aneurysm which measures approximately 4.0 x 3.8 cm.  This aneurysm contains a large burden of eccentric nonenhancing material, most compatible with thrombus.  There is also mild aneurysmal dilatation of the common iliac arteries bilaterally (both are 16 mm in diameter).  An IVC filter is in position with the tip terminating at the level of the left renal vein.  Prostate gland is mildly heterogeneous, but otherwise unremarkable in appearance.  Urinary bladder is unremarkable.  Musculoskeletal: Advanced degenerative disc disease at L4-L5 with complete loss of the intervertebral disc height and discogenic changes of the adjacent vertebral body endplate. There are no aggressive appearing lytic or blastic lesions noted in the visualized portions of the skeleton.  Old healed left sided posterior rib fractures are incidentally noted.  IMPRESSION: 1.  There are two lesions in the left kidney, both of which are compatible with simple cysts.  No suspicious renal lesions are identified on today's examination. 2. Atherosclerosis, including at least three vessel coronary artery disease.  In addition, there is a 4.0 x 3.8 cm fusiform infrarenal abdominal aortic aneurysm, and and there are small bilateral common iliac artery aneurysms, as above. 3. Additional incidental findings, as above.   Original Report Authenticated By: Trudie Reed, M.D.     Microbiology: No results found for this or any previous visit (from the past 240 hour(s)).   Labs: Basic Metabolic Panel:  Recent Labs Lab 06/02/12 1031 06/03/12 0500  NA 138 140  K 4.1 4.2  CL 101 105  CO2 27 27  GLUCOSE 128* 122*  BUN 18 15  CREATININE 1.11 0.94   CALCIUM 9.5 9.1   Liver Function Tests:  Recent Labs Lab 06/02/12 1031 06/03/12 0500  AST 20 16  ALT 11 9  ALKPHOS 58 54  BILITOT 0.4 0.6  PROT 7.5 7.0  ALBUMIN 4.0 3.7   No results found for this basename: LIPASE, AMYLASE,  in the last 168 hours No results found for this basename: AMMONIA,  in the last 168 hours CBC:  Recent Labs Lab 06/02/12 1031 06/03/12 0500  WBC 7.3 7.8  NEUTROABS 5.1  --   HGB 13.5 14.3  HCT 39.6 40.7  MCV 92.1 92.3  PLT 113* 93*   Cardiac Enzymes: No results found for this basename: CKTOTAL, CKMB, CKMBINDEX, TROPONINI,  in the last 168 hours BNP: BNP (last 3 results)  Recent Labs  06/02/12 1031  PROBNP 279.3   CBG:  Recent Labs Lab 06/04/12 1106 06/04/12 1606 06/04/12 2016 06/05/12 0743 06/05/12 1200  GLUCAP 130* 130* 117* 127* 141*       Signed:  Teryl Gubler,CHRISTOPHER  [facility] 06/05/2012, 2:40 PM

## 2012-06-05 NOTE — Progress Notes (Addendum)
Clinical Social Work Department BRIEF PSYCHOSOCIAL ASSESSMENT 06/05/2012  Patient:  [name], [name]     Account Number:  [id number]     Admit date:  06/02/2012  Clinical Social Worker:  Kirke Shaggy  Date/Time:  06/03/2012 10:37 AM  Referred by:  Physician  Date Referred:  06/03/2012 Referred for  SNF Placement   Other Referral:   Interview type:  Patient Other interview type:   and with his wife Lesle Reek.    PSYCHOSOCIAL DATA Living Status:  WIFE Admitted from facility:   Level of care:   Primary support name:  Ladarrell Cornwall Primary support relationship to patient:  SPOUSE Degree of support available:   [place], wife stated that pt normally does pretty good at home until this past fall.    CURRENT CONCERNS Current Concerns  Post-Acute Placement   Other Concerns:    SOCIAL WORK ASSESSMENT / PLAN Pt was falling in and out of sleep due to medications when CSW was in the room meeting with the wife. Wife was pleasant and stated that she is concerned about her husbands health.   Assessment/plan status:   Other assessment/ plan:   Information/referral to community resources:   Gave list of SNF facilities to the wife. [facility] prefers [facility] for [facility]. They are in agreement right now for SNF placement.    PATIENT'S/FAMILY'S RESPONSE TO PLAN OF CARE: They are in agreement for SNF placement for continued PT and OT rehab. CSW will inform pt and his wife of bed offers when they come in.  Placement note:  Pt and his wife have accepted the bed offer for [facility]. [facility] will be tranporting the pt to [facility]. No other CSW needs at this time. Sherald Barge, LCSW-A Clinical Social Worker [phone]

## 2012-06-05 NOTE — Progress Notes (Signed)
.   amiodarone  200 mg Oral Daily  . amLODipine  5 mg Oral Daily  . benzonatate  100 mg Oral TID  . cholecalciferol  1,000 Units Oral Daily  . colesevelam  3,750 mg Oral Q breakfast  . doxazosin  2 mg Oral QHS  . ezetimibe  10 mg Oral Daily  . finasteride  5 mg Oral Daily  . furosemide  40 mg Oral BID  . hydrALAZINE  25 mg Oral TID  . insulin aspart  0-5 Units Subcutaneous QHS  . insulin aspart  0-9 Units Subcutaneous TID WC  . levalbuterol  0.63 mg Nebulization TID  . niacin  500 mg Oral QHS  . pantoprazole  40 mg Oral Daily  . potassium chloride SA  20 mEq Oral Daily  . ramipril  10 mg Oral BID  . traZODone  50 mg Oral QHS

## 2012-06-05 NOTE — Progress Notes (Signed)
Physical Therapy Treatment Patient Details Name: [name] MRN: [id number] DOB: 30-Jan-1929 Today's Date: 06/05/2012 Time: 4098-1191 PT Time Calculation (min): 18 min  PT Assessment / Plan / Recommendation Comments on Treatment Session       Follow Up Recommendations  SNF     Does the patient have the potential to tolerate intense rehabilitation     Barriers to Discharge        Equipment Recommendations  None recommended by PT    Recommendations for Other Services    Frequency Min 3X/week   Plan Discharge plan remains appropriate;Frequency remains appropriate    Precautions / Restrictions Precautions Precautions: Fall Precaution Comments: high risk for skin break down Restrictions Weight Bearing Restrictions: No   Pertinent Vitals/Pain C/o rib pain unable to rate.      Mobility  Bed Mobility Bed Mobility: Sit to Supine Supine to Sit: Not tested (comment) Sitting - Scoot to Edge of Bed: Not tested (comment) Sit to Supine: 3: Mod assist Sit to Supine: Patient Percentage: 50% Details for Bed Mobility Assistance: Assist for bilateral LEs, VCs for technque.   Transfers Transfers: Sit to Stand;Stand to Sit Sit to Stand: 3: Mod assist;From chair/3-in-1;With upper extremity assist Sit to Stand: Patient Percentage: 70% Stand to Sit: 4: Min assist;Other (comment);To bed;With upper extremity assist Stand to Sit: Patient Percentage: 80% Stand Pivot Transfers: Not tested (comment) Transfer via Lift Equipment: Stedy Details for Transfer Assistance: Pt stood x 4 in Steady lift.  Cueing for UE use, assist to extend hips/trunk.  Able to stand 1 minute in steady lift before needing to sit secondary to fatigue Ambulation/Gait Ambulation/Gait Assistance: Not tested (comment)    Exercises     PT Diagnosis:    PT Problem List:   PT Treatment Interventions:     PT Goals Acute Rehab PT Goals PT Goal Formulation: With patient Time For Goal Achievement: 06/17/12 Potential to  Achieve Goals: Fair Pt will go Supine/Side to Sit: with min assist Pt will go Sit to Supine/Side: with min assist PT Goal: Sit to Supine/Side - Progress: Progressing toward goal Pt will go Sit to Stand: with min assist PT Goal: Sit to Stand - Progress: Progressing toward goal Pt will go Stand to Sit: with min assist PT Goal: Stand to Sit - Progress: Progressing toward goal Pt will Transfer Bed to Chair/Chair to Bed: with min assist  Visit Information  Last PT Received On: 06/05/12    Subjective Data  Subjective: Pt requested to return to bed RN had difficulty tranferring pt from chair yesterday had to use a maxi-lift.  Patient Stated Goal: Return home   Cognition  Cognition Overall Cognitive Status: Appears within functional limits for tasks assessed/performed Arousal/Alertness: Awake/alert Orientation Level: Appears intact for tasks assessed Behavior During Session: [facility] for tasks performed Cognition - Other Comments: HOH - had to repeat questions but answered correctly when question was understood    Balance     End of Session PT - End of Session Equipment Utilized During Treatment: Gait belt Activity Tolerance: Patient tolerated treatment well;Patient limited by fatigue Patient left: in bed;with call bell/phone within reach;with bed alarm set Nurse Communication: Mobility status;Need for lift equipment   GP     Sadler Teschner 06/05/2012, 1:25 PM Kathye Cipriani L. Prithvi Kooi DPT [phone]

## 2012-06-06 ENCOUNTER — Other Ambulatory Visit: Payer: Self-pay | Admitting: Endocrinology

## 2012-06-06 ENCOUNTER — Other Ambulatory Visit (HOSPITAL_COMMUNITY): Payer: Self-pay | Admitting: Internal Medicine

## 2012-06-06 DIAGNOSIS — R911 Solitary pulmonary nodule: Secondary | ICD-10-CM

## 2012-06-12 ENCOUNTER — Other Ambulatory Visit (HOSPITAL_COMMUNITY): Payer: Medicare Other

## 2012-06-21 ENCOUNTER — Encounter (HOSPITAL_COMMUNITY): Payer: Self-pay

## 2012-06-21 ENCOUNTER — Encounter (HOSPITAL_COMMUNITY)
Admission: RE | Admit: 2012-06-21 | Discharge: 2012-06-21 | Disposition: A | Discharge status: Home / Self Care | Payer: Medicare Other | Source: Ambulatory Visit | Attending: Internal Medicine | Admitting: Internal Medicine

## 2012-06-21 DIAGNOSIS — I2510 Atherosclerotic heart disease of native coronary artery without angina pectoris: Secondary | ICD-10-CM | POA: Diagnosis not present

## 2012-06-21 DIAGNOSIS — S2249XA Multiple fractures of ribs, unspecified side, initial encounter for closed fracture: Secondary | ICD-10-CM | POA: Insufficient documentation

## 2012-06-21 DIAGNOSIS — N400 Enlarged prostate without lower urinary tract symptoms: Secondary | ICD-10-CM | POA: Insufficient documentation

## 2012-06-21 DIAGNOSIS — I700 Atherosclerosis of aorta: Secondary | ICD-10-CM | POA: Insufficient documentation

## 2012-06-21 DIAGNOSIS — R911 Solitary pulmonary nodule: Secondary | ICD-10-CM | POA: Insufficient documentation

## 2012-06-21 DIAGNOSIS — I714 Abdominal aortic aneurysm, without rupture, unspecified: Secondary | ICD-10-CM | POA: Insufficient documentation

## 2012-06-21 DIAGNOSIS — N281 Cyst of kidney, acquired: Secondary | ICD-10-CM | POA: Insufficient documentation

## 2012-06-21 DIAGNOSIS — X58XXXA Exposure to other specified factors, initial encounter: Secondary | ICD-10-CM | POA: Insufficient documentation

## 2012-06-21 DIAGNOSIS — J90 Pleural effusion, not elsewhere classified: Secondary | ICD-10-CM | POA: Insufficient documentation

## 2012-06-21 LAB — GLUCOSE, CAPILLARY: Glucose-Capillary: 174 mg/dL — ABNORMAL HIGH (ref 70–99)

## 2012-06-21 MED ORDER — FLUDEOXYGLUCOSE F - 18 (FDG) INJECTION
15.6000 | Freq: Once | INTRAVENOUS | Status: AC | PRN
  Administered 2012-06-21: 15.6 via INTRAVENOUS

## 2012-07-02 ENCOUNTER — Institutional Professional Consult (permissible substitution) (INDEPENDENT_AMBULATORY_CARE_PROVIDER_SITE_OTHER): Payer: Medicare Other | Admitting: Cardiothoracic Surgery

## 2012-07-02 ENCOUNTER — Other Ambulatory Visit: Payer: Self-pay

## 2012-07-02 VITALS — BP 114/76 | HR 65 | Resp 20 | Ht 71.0 in | Wt 208.0 lb

## 2012-07-02 DIAGNOSIS — R911 Solitary pulmonary nodule: Secondary | ICD-10-CM

## 2012-07-02 DIAGNOSIS — D381 Neoplasm of uncertain behavior of trachea, bronchus and lung: Secondary | ICD-10-CM

## 2012-07-02 NOTE — Patient Instructions (Signed)
Pulmonary Nodule A pulmonary (lung) nodule is small, round growth in the lung. The size of a pulmonary nodule can be as small as a pencil eraser (1/5 inch or 4 millmeters) to a little bigger than your biggest toenail (1 inch or 25 millimeters). A pulmonary nodule is usually an unplanned finding. It may be found on a chest X-ray or a computed tomography (CT) scan when you have imaging tests of your lungs done. When a pulmonary nodule is found, tests will be done to determine if the nodule is benign (not cancerous) or malignant (cancerous). Follow-up treatment or testing is based on the size of the pulmonary nodule and your risk of getting lung cancer.  CAUSES Causes of pulmonary nodules can vary.  Benign pulmonary nodules  can be caused from different things. Some of these things include:  Infection. This can be a common cause of a benign pulmonary nodule. The infection may be active (a current infection) or an old infection that is no longer active. Three types of infections can cause a pulmonary nodule. These are:  Bacterial Infection.  Fungal infection.  Viral Infections.  Hematoma. This is a bruise in the lung. A hematoma can happen from an injury to your chest.  Some common diseases can lead to benign pulmonary nodules. For example, rheumatoid arthritis can be a cause of a pulmonary nodule.  Other unusual things can cause a benign pulmonary nodule. These can include:  Having had tuberculosis.  Rare diseases, such as a lung cyst. Malignant pulmonary nodules.  These are cancerous growths. The cancer may have:  Started in the lung. Some lung cancers first detected as a pulmonary nodule.  Spread to the lung from cancer somewhere else in the body. This is called metastatic cancer.  Certain risk factors make a cancerous pulmonary nodule more likely. They include:  Age. As people get older, a pulmonary nodule is more likely to be cancerous.  Cancer history. If one of your immediate  family members has had cancer, you have a higher risk of developing cancer.  Smoking. This includes people who currently smoke and those who have quit. DIAGNOSIS To diagnose whether a pulmonary nodule is benign or malignant, a variety of tests will be done. This includes things such as:  Health history. Questions regarding your current health, past health, and family health will be asked.  Blood tests. Results of blood work can show:  Tumor markers for cancer.  Any type of infection.  A skin test called a tuberculin (TB) test may be done. This test can tell if you have been exposed to the germ that causes tuberculosis.  Imaging tests. These take pictures of your lungs. Types of imaging tests include:  Chest X-ray. This can help in several ways. An X-ray gives a close-up look at the pulmonary nodule. A new X-ray can be compared with any X-rays you have had in the past.   Computed tomography  (CT) scan. This test shows smaller pulmonary nodules more clearly than an X-ray.  Positron emission tomography  (PET) scan. This is a test that uses a radioactive substance to identify a pulmonary nodule. A safe amount of radioactive substance is injected into the blood stream. Then, the scan takes a picture of the pulmonary nodule. A malignant pulmonary nodule will absorb the substance faster than a benign pulmonary nodule. The radioactive substance is eliminated from your body in your urine.  Biopsy.  This removes a tiny piece of the pulmonary nodule so it can be checked  under a microscope. Medicine will be given to help keep you relaxed and pain free when a biopsy is done. Types of biopsies include:  Bronchoscopy . This is a surgical procedure. It can be used for pulmonary nodules that are close to the airways in the lung. It uses a scope (a thin tube) with a tiny camera and light on the end. The scope is put in the windpipe. Your caregiver can then see inside the lung. A tiny tool put through the  scope is used to take a small sample of the pulmonary nodule tissue.  Transthoracic needle aspiration . This method is used if the pulmonary nodule is far away from the air passages in the lung. A long, thin needle is put through the chest into the lung nodule. A CT scan is done at the same time which can make it easier to locate the pulmonary nodule.  Surgical lung biopsy . This is a surgical procedure in which the pulmonary nodule is removed. This is usually recommended when the pulmonary nodule is most likely malignant or a biopsy cannot be obtained by either bronchoscopy or transthoracic needle aspiration. PULMONARY NODULE FOLLOW-UP RECOMMENDATIONS The frequency of pulmonary nodule follow-up is based on your risk factors and size of the pulmonary nodule. If your caregiver suspects the pulmonary nodule is cancerous or the pulmonary nodule changes during any of the follow-up CT scans, additional testing or biopsies will be done.   If you have no or low risk of getting lung cancer (non-smoker, no personal cancer history), recommended follow-up is based on the following pulmonary nodule size:  A pulmonary nodule that is < 4 mm does not require any follow-up.  A pulmonary nodule that is 4 to 6 mm should be re-imaged by CT scan in 12 months.  A pulmonary nodule that is 6 to 8 mm should be re-imaged by CT scan at 6 to 12 months and then again at 18 to 24 months if no change in size.  A pulmonary nodule > 8 mm in size should be followed closely and re-imaged by CT scan at 3, 9, and 24 months.   If you are at risk of getting lung cancer (current or former smoker, family history of cancer), recommended follow-up is based on the following pulmonary nodule size:  A pulmonary nodule that is < 4 mm in size should be re-imaged by CT scan in 12 months.  A pulmonary nodule that is 4 to 6 mm in size should be re-imaged by CT scan at 6 to 12 months and again at 18 to 24 months.  A pulmonary nodule that is  6 to 8 mm in size should be re-imaged by CT scan at 3, 9, and 24 months.  A pulmonary nodule > 8 mm in size should be followed closely and re-imaged by CT scan at 3, 9, and 24 months. SEEK MEDICAL CARE IF: While waiting for test results to determine what type of pulmonary nodule you have, be sure to contact your caregiver if you:  Have trouble breathing when you are active.  Feel sick or unusually tired.  Do not feel like eating.  Lose weight without trying to.  Develop chills or night sweats.  Mild or moderate fevers generally have no long-term effects and often do not require treatment. There are a few exceptions (see below). SEEK IMMEDIATE MEDICAL CARE IF:  You cannot catch your breath or you begin wheezing.  You cannot stop coughing.  You cough up blood.  You feel like you are going to pass out or become dizzy.  You have sudden chest pain.  You have a fever or persistent symptoms for more than 72 hours.  You have a fever and your symptoms suddenly get worse. MAKE SURE YOU   Understand these instructions.  Will watch your condition.  Will get help right away if you are not doing well or get worse. Document Released: 12/11/2008 Document Revised: 05/08/2011 Document Reviewed: 12/11/2008 [facility] Patient Information 2013 [place], [place].  Lung Biopsy A Lung Biopsy involves taking a small piece of lung tissue so it can be examined under a microscope by a pathologist to help diagnose many different lung disorders. This test can be done in a variety of ways. One way is to open the chest during surgery and remove a piece of lung tissue. It can also be done through a bronchoscope (a pencil-sized telescope) which allows the caregiver to remove a piece of tissue through your trachea (the large breathing tube to your lungs). Another procedure is a needle biopsy of the lung, which involves inserting a needle into the lung and removing a small piece of tissue. POSSIBLE COMPLICATIONS  INCLUDE:  Collapse of the lung  Bleeding  Infection PREPARATION FOR TEST No preparation or fasting is necessary. NORMAL FINDINGS No evidence of pathology. Ranges for normal findings may vary among different laboratories and hospitals. You should always check with your doctor after having lab work or other tests done to discuss the meaning of your test results and whether your values are considered within normal limits. MEANING OF TEST  Your caregiver will go over the test results with you and discuss the importance and meaning of your results, as well as treatment options and the need for additional tests if necessary. OBTAINING THE TEST RESULTS It is your responsibility to obtain your test results. Ask the lab or department performing the test when and how you will get your results. Document Released: 05/04/2004 Document Revised: 05/08/2011 Document Reviewed: 01/25/2008 [facility] Patient Information 2013 [place], [place]. Needle Biopsy of Lung Care After A needle biopsy is a procedure to get a sample of cells from your body for testing. A needle biopsy may be used to take tissue or fluid samples from muscles, bones and organs, such as the liver or lungs. The sample from your needle biopsy may help your doctor determine what is causing:  A mass or lump. A needle biopsy may reveal whether a mass or lump is a cyst, an infection, a benign tumor or cancer.  Infection. Tests from a needle biopsy can help doctors determine what germs are causing an infection so that the best medicines can be used for treatment.  Inflammation. Looking closely at a needle biopsy sample may reveal what is causing inflammation and what types of cells are involved. Your caregiver has now completed a needle biopsy of the lung to help diagnose a medical condition or to rule out a disease or condition. A needle biopsy may also be used to assess the progress of a treatment. AFTER THE PROCEDURE Once your caregiver has  collected enough cells or tissue for analysis, your needle biopsy procedure is complete. Your biopsy sample is sent to a laboratory to be tested. The results may be available in a day or two. More technical tests may require more time. Ask your caregiver how long you can expect to wait.  Your health care team may apply a bandage over the areas where the needle was inserted. You may  be asked to apply pressure to the bandage for several minutes to ensure there is minimal bleeding. In most cases, you can leave when your needle biopsy procedure is completed. Do not drive yourself home. Someone else should take you home. Whether you can leave right away or whether you will need to stay for observation depends on how you feel and the exam by your caregiver after the biopsy. In some cases your health care team may want to observe you for a few hours to ensure you do not have complications from your biopsy. If you received an IV sedative or general anesthetic, you will be taken to a comfortable place to relax while the medication wears off. Expect to take it easy for the rest of the day. Protect the area where you received the needle biopsy by keeping the bandage in place for as long as instructed. You may feel some mild pain or discomfort in the area, but this should stop in a day or two. Only take over-the-counter or prescription medicines for pain, discomfort, or fever as directed by your caregiver. SEEK MEDICAL CARE IF:   You have pain at the biopsy site that worsens or is not helped by medicines.  You have swelling at the needle biopsy site.  You have drainage from the biopsy site.  You have new or unusual pain in your back or at the top of one or both shoulders. SEEK IMMEDIATE MEDICAL CARE IF:   You have a fever.  You develop lightheadedness or fainting.  You have chest pain or shortness of breath.  You have bleeding that does not stop with pressure or a bandage.  You have weakness or numbness  in your legs. Document Released: 12/11/2006 Document Revised: 05/08/2011 Document Reviewed: 12/11/2006 [facility] Patient Information 2013 [place], [place].

## 2012-07-02 NOTE — Progress Notes (Signed)
[address]            [place] [address]          [phone]      ACESON LABELL [facility] Medical Record #[id number] Date of Birth: 20-Apr-1928  Referring: Ezequiel Kayser, MD Primary Care: Ezequiel Kayser, MD  Chief Complaint:    Chief Complaint  Patient presents with  . Lung Lesion    surgical eval for Left lung nodule, PET Scan 06/21/12, CTA Chest 06/02/12    History of Present Illness:    Patient is a 79 year old male who thinks he's been referred to the office to "have his heart checked". In reality he's been referred because of a enlarging left lung nodule. Approximately one month ago while at home he fell in the kitchen and suffered fractured ribs on the left and [facility] scan of the chest abdomen and a PET scan were performed because of the finding of 2.5 mm lung nodule in the left lung. Previous scan in 2012 showed this lung nodule to be approximately 1 cm in size. No workup was done at that time. The patient is currently staying in [facility] because of his overall medical condition, but he plans to return home. He is limited both by his current age and also long-term disability since a right hemispheric stroke in 1989.      Current Activity/ Functional Status:  Patient is not independent with mobility/ambulation, transfers, ADL's, IADL's.  Zubrod Score: At the time of surgery this patient's most appropriate activity status/level should be described as: []  Normal activity, no symptoms []  Symptoms, fully ambulatory [x]  Symptoms, in bed less than or equal to 50% of the time []  Symptoms, in bed greater than 50% of the time but less than 100% []  Bedridden []  Moribund   Past Medical History  Diagnosis Date  . Diabetes mellitus   . Chronic shoulder pain   . Hypertension   . Hyperlipidemia   . Stroke 1997  . Atrial fibrillation   . Atrial flutter   . Tachycardia-bradycardia   .  Bradycardia   . GI bleeding   . Frequent falls   . BPH (benign prostatic hypertrophy)   . Asthma   . COPD (chronic obstructive pulmonary disease)   . Chronic constipation   . Spinal stenosis   . Peripheral neuropathy   . Hard of hearing   . Post-traumatic stress   . Osteoarthritis   . Arm fracture, left   . War injury due to shrapnel     Left shoulder  . Fall against object 06/02/2012  . Multiple closed fractures of ribs of right side 06/02/2012  . Chronic systolic CHF (congestive heart failure) 09/2012    EF 20-25%  . Hypertension   . Hyperlipidemia   . Diabetes mellitus, type 2   . Stroke   . A-fib   . BPH (benign prostatic hyperplasia)     Past Surgical History  Procedure Laterality Date  . Insert / replace / remove pacemaker  08-16-2010  . Knee surgery    . Ivc filter    . Cardioversion      2005    Family History  Problem Relation Age of Onset  . Diabetes Mother     died on her 36's  . Heart attack Father     died on his  70's  . Peripheral vascular disease Mother   . Diabetes Brother     History   Social History  . Marital Status: Married    Spouse Name: N/A    Number of Children: 1  . Years of Education: N/A   Occupational History  . RETIRED    Social History Main Topics  . Smoking status: Former Smoker    Quit date: 02/27/1994  . Smokeless tobacco: Never Used  . Alcohol Use: No  . Drug Use: No  . Sexually Active: Not on file   Other Topics Concern  . Not on file   Social History Narrative   21 years in the [employer]   20 years with [employer]   50 ppy tobacco HX- quit in 1996   No H/O of alcohol use    History  Smoking status  . Former Smoker  . Quit date: 02/27/1994  Smokeless tobacco  . Never Used    History  Alcohol Use No     Allergies  Allergen Reactions  . Digoxin And Related   . Zocor (Simvastatin)     Current Outpatient Prescriptions  Medication Sig Dispense Refill  . acetaminophen (TYLENOL) 325 MG tablet  Take 650 mg by mouth every 6 (six) hours as needed.        [name] amiodarone (PACERONE) 200 MG tablet Take 200 mg by mouth daily.        [name] amLODipine (NORVASC) 5 MG tablet Take 5 mg by mouth daily.        [name] aspirin 81 MG tablet Take 81 mg by mouth daily.        [name] atorvastatin (LIPITOR) 10 MG tablet Take 10 mg by mouth daily.      . benzonatate (TESSALON) 100 MG capsule Take 1 capsule (100 mg total) by mouth 3 (three) times daily.  21 capsule  0  . calcium-vitamin D (OSCAL WITH D) 500-200 MG-UNIT per tablet Take 1 tablet by mouth daily.        . Cholecalciferol (VITAMIN D3) 1000 UNITS CAPS Take 1 each by mouth daily.        . Colesevelam HCl (WELCHOL) 3.75 G PACK Take 1 each by mouth daily. One packet with food with largest meal of day       . doxazosin (CARDURA) 2 MG tablet Take 2 mg by mouth at bedtime.        [name] ezetimibe (ZETIA) 10 MG tablet Take 10 mg by mouth daily.        . finasteride (PROSCAR) 5 MG tablet Take 5 mg by mouth daily.        . furosemide (LASIX) 40 MG tablet Take 40 mg by mouth 2 (two) times daily.        . hydrALAZINE (APRESOLINE) 25 MG tablet Take 25 mg by mouth 3 (three) times daily.        [name] HYDROcodone-acetaminophen (NORCO/VICODIN) 5-325 MG per tablet Take 1 tablet by mouth every 4 (four) hours as needed.  60 tablet  0  . insulin aspart (NOVOLOG) 100 UNIT/ML injection For CBG 70-120=No Insulin; CBG 120-150=1 unit; CBG 151-200=2 units; CBG 201-250=3 units; CBG 251-300=5 units; CBG 301-350=7 units; CBG 351-400=9 units.  1 vial  0  . niacin (NIASPAN) 500 MG CR tablet Take 500 mg by mouth at bedtime.       . pantoprazole (PROTONIX) 40 MG tablet Take 40 mg by mouth daily.        . potassium chloride SA (  K-DUR,KLOR-CON) 20 MEQ tablet Take 20 mEq by mouth daily.        . ramipril (ALTACE) 10 MG capsule Take 10 mg by mouth 2 (two) times daily.        . traZODone (DESYREL) 50 MG tablet Take 50 mg by mouth at bedtime.         No current facility-administered medications for this  visit.       Review of Systems:     Cardiac Review of Systems: Y or N  Chest Pain [   n ]  Resting SOB [ on oxygen since in nursing  ] Exertional SOB  [  ]  Orthopnea [  ]   Pedal Edema [ n  ]    Palpitations [n  ] Syncope  [ n ]   Presyncope [ n  ]  General Review of Systems: [Y] = yes [  ]=no Constitional: recent weight change [gaining  ]; anorexia [  ]; fatigue [  ]; nausea [  ]; night sweats [  ]; fever [  ]; or chills [  ];                                                                                                                                          Dental: poor dentition[  ];   Eye : blurred vision [  ]; diplopia [   ]; vision changes [  ];  Amaurosis fugax[  ]; Resp: cough [  ];  wheezing[  ];  hemoptysis[  ]; shortness of breath[  ]; paroxysmal nocturnal dyspnea[  ]; dyspnea on exertion[ y ]; or orthopnea[  ];  GI:  gallstones[  ], vomiting[  ];  dysphagia[  ]; melena[  ];  hematochezia [  ]; heartburn[  ];   Hx of  Colonoscopy[  ]; GU: kidney stones [  ]; hematuria[  ];   dysuria [  ];  nocturia[  ];  history of     obstruction [  ]; urinary frequency [  ]             Skin: rash, swelling[  ];, hair loss[  ];  peripheral edema[  ];  or itching[  ]; Musculosketetal: myalgias[  ];  joint swelling[  ];  joint erythema[  ];  joint pain[  ];  back pain[  ];  Heme/Lymph: bruising[  ];  bleeding[  ];  anemia[  ];  Neuro: TIA[  ];  headaches[  ];  stroke[y  ];  vertigo[  ];  seizures[  ];   paresthesias[  ];  difficulty walking[y  ];  Psych:depression[  ]; anxiety[  ];  Endocrine: diabetes[  ];  thyroid dysfunction[  ];  Immunizations: Flu [  ]; Pneumococcal[  ];  Other:  Physical Exam: BP 114/76  Pulse 65  Resp 20  Ht 5\' 11"  (1.803 m)  Wt  208 lb (94.348 kg)  BMI 29.02 kg/m2  SpO2 96%  General appearance: alert, cooperative, no distress and Patient has difficulty with mobility secondary to profound left arm and leg weakness Neurologic: left sided weakness Heart:  regular rate and rhythm, S1, S2 normal, no murmur, click, rub or gallop Lungs: clear to auscultation bilaterally Abdomen: soft, non-tender; bowel sounds normal; no masses,  no organomegaly Extremities: extremities normal, atraumatic, no cyanosis or edema and Homans sign is negative, no sign of DVT Patient has brace on his left leg and mobilizes with a wheelchair, he is alert and able to give some of this history details, he has no carotid bruits, has no cervical supraclavicular adenopathy, the known abdominal aortic aneurysm is not palpable physical exam secondary to his mild obesity his 1+ DP and PT pulses bilaterally   Diagnostic Studies & Laboratory data:     Recent Radiology Findings:   CT scan of the chest dated 12 /2012 showed a 1 cm suspicious nodule, comparison to the current CT scan this appears to be the same nodule that is now 2.5 x 2.2. He reported that time did not mention lung mass      Ct Angio Chest Pe W/cm &/or Wo Cm  06/02/2012  *RADIOLOGY REPORT*  Clinical Data: Chest pain  CT ANGIOGRAPHY CHEST  Technique:  Multidetector CT imaging of the chest using the standard protocol during bolus administration of intravenous contrast. Multiplanar reconstructed images including MIPs were obtained and reviewed to evaluate the vascular anatomy.  Contrast: 80mL OMNIPAQUE IOHEXOL 350 MG/ML SOLN  Comparison: 02/13/2011  Findings: Lungs/pleura: There is no pleural effusion identified. Within the left upper lobe there is a suspicious nodule measuring 2.5 x 2.2 cm, image 41/series 5.  Moderate changes of emphysema. No pulmonary contusion identified.  Heart/Mediastinum: Mild cardiac enlargement.  Coronary artery calcifications involve the RCA, LAD and left circumflex coronary arteries.  No pericardial effusion.  There is no enlarged mediastinal or hilar lymph nodes.  The pulmonary artery appears patent.  There is no lobe bar or segmental pulmonary arterial embolus identified.  Upper abdomen: The adrenal  glands both appear normal. Suspicious appearing cyst is noted arising from the upper pole of the left kidney measuring 4.9 cm.  Bones/Musculoskeletal:  There is mild multilevel spondylosis within the thoracic spine.  Acute left sixth and seventh rib fractures are noted.  No aggressive lytic or sclerotic bone lesions identified.  IMPRESSION:  1.  No evidence for acute pulmonary embolus. 2.  There is a nodule in the left upper lobe which has increased in size from previous exam.  This is concerning for primary bronchogenic carcinoma. 3.  Suspicious nodule within the upper pole the left kidney is only partially visualized.  Advise further evaluation with non emergent cross-sectional imaging through the kidneys. In this patient who has a pacemaker the study of choice would be a pre and post contrast CT of the kidneys.   Original Report Authenticated By: Signa Kell, M.D.    Nm Pet Image Restag (ps) Skull Base To Thigh  06/21/2012  *RADIOLOGY REPORT*  Clinical Data: Initial treatment strategy for left upper lobe lung nodule.  NUCLEAR MEDICINE PET SKULL BASE TO THIGH  Fasting Blood Glucose:  174  Technique:  15.6 mCi F-18 FDG was injected intravenously. CT data was obtained and used for attenuation correction and anatomic localization only.  (This was not acquired as a diagnostic CT examination.) Additional exam technical data entered on technologist worksheet.  Comparison:  CTA chest dated 06/02/2012.  CT abdomen dated 06/03/2012.  Findings:  Neck: No hypermetabolic lymph nodes in the neck.  Chest:  2.1 x 2.5 cm spiculated lingular nodule (series 2/image 83), max SUV 10.5, compatible with primary bronchogenic neoplasm.  Underlying emphysematous changes.  Small left pleural effusion, increased.  Mild focal hypermetabolism in the left hilar region, equivocal, max SUV 5.7 (PET image 85).  No underlying hilar node is evident on prior CTA chest.  Left subclavian ICD.  Coronary atherosclerosis.  Atherosclerotic  calcifications of the aortic arch.  Abdomen/Pelvis:  Motion degraded images.  No abnormal hypermetabolic activity within the liver, pancreas, adrenal glands, or spleen.  No hypermetabolic lymph nodes in the abdomen or pelvis.  Left renal cysts, better evaluated on prior CT.  3.7 x 4.0 cm infrarenal abdominal aortic aneurysm.  Extensive vascular calcifications.  Prostatomegaly which indents the base of the bladder.  Skeleton:  Heterogeneous FDG uptake in multiple thoracic vertebral bodies and the inferior sternum, max SUV 5.9 (image 92).  No corresponding focal abnormality on CT.  Left lateral 6th and 7th rib fractures, max SUV 5.4 (PET image 87).  IMPRESSION: 2.5 cm spiculated lingular nodule, max SUV 10.5, compatible with primary bronchogenic neoplasm.  No definite findings of metastatic disease.  Mild hypermetabolism in the left hilum, max SUV 5.7, but without underlying hilar node on prior CTA chest.  Heterogeneous FDG uptake within multiple thoracic vertebral bodies and the inferior sternum, max SUV 5.9, without corresponding focal abnormality on CT.  Left lateral 6th and 7th rib fractures.   Original Report Authenticated By: Charline Bills, M.D.    Ct Angio Abd/pel W/ And/or W/o  06/03/2012  *RADIOLOGY REPORT*  Clinical Data:  Assess left renal nodule.  Evaluate for metastatic disease to the bones.  CT ABDOMEN WITHOUT AND WITH CONTRAST CT PELVIS WITH CONTRAST  Technique:  Multidetector CT imaging of the abdomen was performed initially following the standard protocol before administration of intravenous contrast.  Multidetector CT imaging of the abdomen and pelvis was then performed following the standard protocol during the bolus injection of intravenous contrast.  Contrast: OMNIPAQUE IOHEXOL 300 MG/ML  SOLN  Comparison:  CT of the abdomen and pelvis 06/16/2009.  Findings:  Lung Bases: New linear opacities throughout the lung bases bilaterally most compatible with areas of subsegmental atelectasis.  Trace left pleural effusion is new compared to yesterday's examination.  Heart size is mildly enlarged.  Pacemaker wires terminating in the right atrium and right ventricular apex.  A small hiatal hernia.  Calcifications of the aortic valve. Atherosclerotic calcifications of the left anterior descending, left circumflex and right coronary arteries.  Abdomen/Pelvis:  There are two lesions in the left kidney, both of which are low attenuation, smoothly marginated without evidence of internal enhancement, compatible with simple cysts, the largest of which extends exophytically from the upper pole of the left kidney measuring 5.3 x 5.5 cm.  No definite enhancing left renal lesion is otherwise noted.  There is a small amount of cortical thinning in the medial aspect of the upper pole of the right kidney, most compatible with scarring.  The right kidney is otherwise unremarkable in appearance.  High attenuation material filling the lumen of the gallbladder likely represents vicarious excretion of contrast material from yesterday's CT examination.  No signs of acute cholecystitis.  The appearance of the liver, pancreas and bilateral adrenal glands is unremarkable.  The spleen is not visualized and is presumably surgically absent (no surgical clips are noted in the left upper quadrant).  Normal  appendix.  No significant volume of ascites, no pneumoperitoneum and no pathologic distension of small bowel. Extensive atherosclerosis throughout the abdominal and pelvic vasculature, including a fusiform infrarenal abdominal aortic aneurysm which measures approximately 4.0 x 3.8 cm.  This aneurysm contains a large burden of eccentric nonenhancing material, most compatible with thrombus.  There is also mild aneurysmal dilatation of the common iliac arteries bilaterally (both are 16 mm in diameter).  An IVC filter is in position with the tip terminating at the level of the left renal vein.  Prostate gland is mildly heterogeneous, but  otherwise unremarkable in appearance.  Urinary bladder is unremarkable.  Musculoskeletal: Advanced degenerative disc disease at L4-L5 with complete loss of the intervertebral disc height and discogenic changes of the adjacent vertebral body endplate. There are no aggressive appearing lytic or blastic lesions noted in the visualized portions of the skeleton.  Old healed left sided posterior rib fractures are incidentally noted.  IMPRESSION: 1.  There are two lesions in the left kidney, both of which are compatible with simple cysts.  No suspicious renal lesions are identified on today's examination. 2. Atherosclerosis, including at least three vessel coronary artery disease.  In addition, there is a 4.0 x 3.8 cm fusiform infrarenal abdominal aortic aneurysm, and and there are small bilateral common iliac artery aneurysms, as above. 3. Additional incidental findings, as above.   Original Report Authenticated By: Trudie Reed, M.D.     Recent Lab Findings: Lab Results  Component Value Date   WBC 7.8 06/03/2012   HGB 14.3 06/03/2012   HCT 40.7 06/03/2012   PLT 93* 06/03/2012   GLUCOSE 122* 06/03/2012   ALT 9 06/03/2012   AST 16 06/03/2012   NA 140 06/03/2012   K 4.2 06/03/2012   CL 105 06/03/2012   CREATININE 0.94 06/03/2012   BUN 15 06/03/2012   CO2 27 06/03/2012   TSH 1.216 06/02/2012   INR 1.03 06/02/2012   HGBA1C 6.3* 06/02/2012      Assessment / Plan:     Enlarging left lung nodule since 2012, now PET positive and 2.5 cm in size. The PET scan is equivocal concerning mediastinal nodes, there is some increased activity in the mediastinum but without corresponding enlarged lymph nodes. With the patient's current medical status surgical resection would not be recommended, however with a tissue diagnosis radiation therapy could be considered. I discussed the probable diagnosis of carcinoma the lung with the patient and recommended that we proceed with needle biopsy of left lung lesion and then followup appointment in  multidisciplinary thoracic oncology clinic to get opinions of medical and radiation oncology after a tissue diagnosis has been established.       Delight Ovens MD  Beeper [phone] Office [phone] 07/02/2012 1:01 PM

## 2012-07-03 ENCOUNTER — Encounter (HOSPITAL_COMMUNITY): Payer: Self-pay | Admitting: Pharmacist

## 2012-07-03 ENCOUNTER — Other Ambulatory Visit: Payer: Self-pay | Admitting: Radiology

## 2012-07-03 DIAGNOSIS — C3490 Malignant neoplasm of unspecified part of unspecified bronchus or lung: Secondary | ICD-10-CM | POA: Insufficient documentation

## 2012-07-03 NOTE — Progress Notes (Signed)
I am documenting that [name] has a lung nodule that is positive for probable lung cancer based on his recent PET scan.  He is being evaluated by Thoracic Surgery and other specialists for this. I hope this documentation will allow for the PET scan he had to be covered. Rodrigo Ran, MD

## 2012-07-04 ENCOUNTER — Other Ambulatory Visit: Payer: Self-pay | Admitting: Radiology

## 2012-07-09 ENCOUNTER — Encounter (HOSPITAL_COMMUNITY): Payer: Self-pay

## 2012-07-09 ENCOUNTER — Ambulatory Visit (HOSPITAL_COMMUNITY)
Admission: RE | Admit: 2012-07-09 | Discharge: 2012-07-09 | Disposition: A | Discharge status: Home / Self Care | Payer: Medicare Other | Source: Ambulatory Visit | Attending: Cardiothoracic Surgery | Admitting: Cardiothoracic Surgery

## 2012-07-09 DIAGNOSIS — E119 Type 2 diabetes mellitus without complications: Secondary | ICD-10-CM | POA: Insufficient documentation

## 2012-07-09 DIAGNOSIS — I5022 Chronic systolic (congestive) heart failure: Secondary | ICD-10-CM | POA: Insufficient documentation

## 2012-07-09 DIAGNOSIS — E785 Hyperlipidemia, unspecified: Secondary | ICD-10-CM | POA: Insufficient documentation

## 2012-07-09 DIAGNOSIS — C3410 Malignant neoplasm of upper lobe, unspecified bronchus or lung: Secondary | ICD-10-CM | POA: Insufficient documentation

## 2012-07-09 DIAGNOSIS — I509 Heart failure, unspecified: Secondary | ICD-10-CM | POA: Insufficient documentation

## 2012-07-09 DIAGNOSIS — D381 Neoplasm of uncertain behavior of trachea, bronchus and lung: Secondary | ICD-10-CM

## 2012-07-09 DIAGNOSIS — J449 Chronic obstructive pulmonary disease, unspecified: Secondary | ICD-10-CM | POA: Insufficient documentation

## 2012-07-09 DIAGNOSIS — I10 Essential (primary) hypertension: Secondary | ICD-10-CM | POA: Insufficient documentation

## 2012-07-09 DIAGNOSIS — J4489 Other specified chronic obstructive pulmonary disease: Secondary | ICD-10-CM | POA: Insufficient documentation

## 2012-07-09 DIAGNOSIS — I700 Atherosclerosis of aorta: Secondary | ICD-10-CM | POA: Insufficient documentation

## 2012-07-09 LAB — GLUCOSE, CAPILLARY
Glucose-Capillary: 111 mg/dL — ABNORMAL HIGH (ref 70–99)
Glucose-Capillary: 108 mg/dL — ABNORMAL HIGH (ref 70–99)

## 2012-07-09 LAB — CBC
MCH: 31.5 pg (ref 26.0–34.0)
MCV: 94.1 fL (ref 78.0–100.0)
Platelets: 131 10*3/uL — ABNORMAL LOW (ref 150–400)
RBC: 4.61 MIL/uL (ref 4.22–5.81)
RDW: 14.4 % (ref 11.5–15.5)
WBC: 4.1 10*3/uL (ref 4.0–10.5)

## 2012-07-09 LAB — PROTIME-INR: Prothrombin Time: 14.1 seconds (ref 11.6–15.2)

## 2012-07-09 MED ORDER — MIDAZOLAM HCL 2 MG/2ML IJ SOLN
INTRAMUSCULAR | Status: AC | PRN
  Administered 2012-07-09: 0.5 mg via INTRAVENOUS

## 2012-07-09 MED ORDER — SODIUM CHLORIDE 0.9 % IV SOLN: Freq: Once | INTRAVENOUS | Status: DC

## 2012-07-09 MED ORDER — FENTANYL CITRATE 0.05 MG/ML IJ SOLN
INTRAMUSCULAR | Status: AC
  Filled 2012-07-09: qty 4

## 2012-07-09 MED ORDER — FENTANYL CITRATE 0.05 MG/ML IJ SOLN
INTRAMUSCULAR | Status: AC | PRN
  Administered 2012-07-09: 25 ug via INTRAVENOUS

## 2012-07-09 MED ORDER — MIDAZOLAM HCL 2 MG/2ML IJ SOLN
INTRAMUSCULAR | Status: AC
  Filled 2012-07-09: qty 4

## 2012-07-09 NOTE — H&P (Signed)
Chief Complaint: "I'm here for lung biopsy" Referring Physician:Gerhardt HPI: [name] is an 79 y.o. male who is found to have a left sided lung mass concerning for malignancy. He has had PET scan, which finds no other obvious metastatic lesions and the pt is now scheduled for IR to perform perc biopsy for tissue diagnosis. PMHx and meds reviewed.  Past Medical History:  Past Medical History  Diagnosis Date  . Diabetes mellitus   . Chronic shoulder pain   . Hypertension   . Hyperlipidemia   . Stroke 1997  . Atrial fibrillation   . Atrial flutter   . Tachycardia-bradycardia   . Bradycardia   . GI bleeding   . Frequent falls   . BPH (benign prostatic hypertrophy)   . Asthma   . COPD (chronic obstructive pulmonary disease)   . Chronic constipation   . Spinal stenosis   . Peripheral neuropathy   . Hard of hearing   . Post-traumatic stress   . Osteoarthritis   . Arm fracture, left   . War injury due to shrapnel     Left shoulder  . Fall against object 06/02/2012  . Multiple closed fractures of ribs of right side 06/02/2012  . Chronic systolic CHF (congestive heart failure) 09/2012    EF 20-25%  . Hypertension   . Hyperlipidemia   . Diabetes mellitus, type 2   . Stroke   . A-fib   . BPH (benign prostatic hyperplasia)     Past Surgical History:  Past Surgical History  Procedure Laterality Date  . Insert / replace / remove pacemaker  08-16-2010  . Knee surgery    . Ivc filter    . Cardioversion      2005    Family History:  Family History  Problem Relation Age of Onset  . Diabetes Mother     died on her 54's  . Heart attack Father     died on his 66's  . Peripheral vascular disease Mother   . Diabetes Brother     Social History:  reports that he quit smoking about 18 years ago. He has never used smokeless tobacco. He reports that he does not drink alcohol or use illicit drugs.  Allergies:  Allergies  Allergen Reactions  . Beta Adrenergic Blockers      unknown  . Digoxin And Related     unknown  . Zocor (Simvastatin)     unknown    Medications: acetaminophen (TYLENOL) 325 MG tablet (Taking) Sig - Route: Take 650 mg by mouth every 6 (six) hours as needed for pain or fever. - Oral Class: Historical Med Number of times this order has been changed since signing: 1 [address] amiodarone (PACERONE) 200 MG tablet (Taking) Sig - Route: Take 200 mg by mouth every morning. - Oral Class: Historical Med Number of times this order has been changed since signing: 2 Order [address] amLODipine (NORVASC) 5 MG tablet (Taking) Sig - Route: Take 5 mg by mouth every morning. - Oral Class: Historical Med Number of times this order has been changed since signing: 3 Order [address] atorvastatin (LIPITOR) 10 MG tablet (Taking) Sig - Route: Take 10 mg by mouth every evening. - Oral Class: Historical Med Number of times this order has been changed since signing: 2 Order [address] benzonatate (TESSALON) 100 MG capsule (Taking) 21 capsule 0 06/05/2012 Sig - Route: Take 1 capsule (100 mg total) by mouth 3 (three) times daily. - Oral Class: No  Print Number of times this order has been changed since signing: 1 Order [address] calcium-vitamin D (OSCAL WITH D) 500-200 MG-UNIT per tablet (Taking) Sig - Route: Take 1 tablet by mouth every morning. - Oral Class: Historical Med Number of times this order has been changed since signing: 3 Order [address] Cholecalciferol (VITAMIN D3) 1000 UNITS CAPS (Taking) Sig - Route: Take 1,000 Units by mouth every morning. - Oral Class: Historical Med Number of times this order has been changed since signing: 3 Order [address] Colesevelam HCl (WELCHOL) 3.75 G PACK (Taking) Sig - Route: Take 3.75 g by mouth daily with supper. One packet with food with largest meal of day - Oral Class: Historical Med Number of times this order has been changed since signing: 3 Order [address] doxazosin (CARDURA) 2 MG tablet (Taking) Sig - Route: Take 2 mg  by mouth at bedtime. - Oral Class: Historical Med Number of times this order has been changed since signing: 2 Order [address] ezetimibe (ZETIA) 10 MG tablet (Taking) Sig - Route: Take 10 mg by mouth every morning. - Oral Class: Historical Med Number of times this order has been changed since signing: 3 Order [address] finasteride (PROSCAR) 5 MG tablet (Taking) Sig - Route: Take 5 mg by mouth every morning. - Oral Class: Historical Med Number of times this order has been changed since signing: 3 Order [address] furosemide (LASIX) 40 MG tablet (Taking) Sig - Route: Take 40 mg by mouth 2 (two) times daily. - Oral Class: Historical Med Number of times this order has been changed since signing: 2 Order [address] hydrALAZINE (APRESOLINE) 25 MG tablet (Taking) Sig - Route: Take 25 mg by mouth 3 (three) times daily. - Oral Class: Historical Med Number of times this order has been changed since signing: 2 Order [address] HYDROcodone-acetaminophen (NORCO/VICODIN) 5-325 MG per tablet (Taking) 60 tablet 0 06/05/2012 Sig - Route: Take 1 tablet by mouth every 4 (four) hours as needed. - Oral Class: Print Number of times this order has been changed since signing: 1 Order [address] niacin (NIASPAN) 500 MG CR tablet (Taking) Sig - Route: Take 500 mg by mouth at bedtime. - Oral Class: Historical Med Number of times this order has been changed since signing: 4 Order [address] potassium chloride SA (K-DUR,KLOR-CON) 20 MEQ tablet (Taking) Sig - Route: Take 20 mEq by mouth every morning. - Oral Class: Historical Med Number of times this order has been changed since signing: 3 Order [address] ramipril (ALTACE) 10 MG capsule (Taking) Sig - Route: Take 10 mg by mouth 2 (two) times daily. - Oral Class: Historical Med Number of times this order has been changed since signing: 2 Order [address] traZODone (DESYREL) 50 MG tablet (Taking) Sig - Route: Take 50 mg by mouth at bedtime    Please HPI for pertinent positives,  otherwise complete 10 system ROS negative.  Physical Exam: Blood pressure 160/74, pulse 60, temperature 97.8 F (36.6 C), temperature source Oral, resp. rate 18, height 5\' 11"  (1.803 m), weight 208 lb (94.348 kg), SpO2 98.00%. Body mass index is 29.02 kg/(m^2).   General Appearance:  Alert, cooperative, no distress, appears stated age  Head:  Normocephalic, without obvious abnormality, atraumatic  ENT: Unremarkable  Neck: Supple, symmetrical, trachea midline  Lungs:   Clear to auscultation bilaterally, no w/r/r, respirations unlabored without use of accessory muscles.  Chest Wall:  No tenderness or deformity  Heart:  Regular rate and rhythm, S1, S2  normal, no murmur, rub or gallop.   Neurologic: Normal affect, no gross deficits.   Results for orders placed during the hospital encounter of 07/09/12 (from the past 48 hour(s))  GLUCOSE, CAPILLARY     Status: Abnormal   Collection Time    07/09/12  9:26 AM      Result Value Range   Glucose-Capillary 111 (*) 70 - 99 mg/dL   Comment 1 Notify RN     Comment 2 Documented in Chart     No results found.  Assessment/Plan Left lung mass Discussed CT guided biopsy, risks and complications including infection, hemorrhage/hemoptysis, PTX, possible chest tube/admission. Labs pending Consent signed in chart  Brayton El PA-C 07/09/2012, 10:07 AM

## 2012-07-09 NOTE — Procedures (Signed)
Technically successful CT guided biopsy of hypermetabolic nodule within the left upper lobe.  No immediate post procedural complications.  

## 2012-07-09 NOTE — ED Notes (Signed)
Patient denies pain and is resting comfortably.  

## 2012-07-09 NOTE — ED Notes (Signed)
Requested bed from [facility]; spoke with Susa Raring. Pt to have CXR at 1230 and can be discharged at 1430

## 2012-07-10 ENCOUNTER — Telehealth: Payer: Self-pay | Admitting: Internal Medicine

## 2012-07-10 NOTE — Telephone Encounter (Signed)
C/D 07/10/12 for appt 07/11/12 °

## 2012-07-11 ENCOUNTER — Ambulatory Visit: Payer: Medicare Other | Admitting: Internal Medicine

## 2012-07-11 ENCOUNTER — Telehealth: Payer: Self-pay | Admitting: *Deleted

## 2012-07-11 ENCOUNTER — Ambulatory Visit: Payer: Medicare Other | Attending: Radiation Oncology | Admitting: Radiation Oncology

## 2012-07-11 NOTE — Telephone Encounter (Signed)
Left VM message regarding appt 

## 2012-07-15 ENCOUNTER — Telehealth: Payer: Self-pay | Admitting: *Deleted

## 2012-07-15 ENCOUNTER — Encounter: Payer: Self-pay | Admitting: *Deleted

## 2012-07-15 NOTE — Telephone Encounter (Signed)
Spoke with pt wife regarding appt 07/18/12 at 12:00.  She verbalized understanding of time and place of appt

## 2012-07-15 NOTE — Progress Notes (Signed)
Spoke with [facility].  Nurse Larita Fife on [address] took message pt has appt 07/18/12 at 12:00 and they will provide him transportation.

## 2012-07-15 NOTE — Telephone Encounter (Signed)
Spoke with wife regarding appt follow up appt for Rad Onc.  I am working with Clydie Braun for appt with Dr. Roselind Messier

## 2012-07-17 ENCOUNTER — Encounter: Payer: Self-pay | Admitting: *Deleted

## 2012-07-17 ENCOUNTER — Telehealth: Payer: Self-pay | Admitting: Internal Medicine

## 2012-07-17 NOTE — Progress Notes (Signed)
Thoracic Location of Tumor / Histology:  LUL lung  Patient presented 05/2012 months ago with symptoms of: fell, could not stand, taken to ED for eval, CT showed lung mass  Biopsies of LUL  (if applicable) revealed: squamous cell carcinoma  Tobacco/Marijuana/Snuff/ETOH use: stopped 20 yrs ago after 25 yr hx  Past/Anticipated interventions by cardiothoracic surgery, if any: none noted  Past/Anticipated interventions by medical oncology, if any: none known at this time, pt was 'no show' for Thoracic clinic  Signs/Symptoms  Weight changes, if any: no  Respiratory complaints, if any: none  Hemoptysis, if any: no  Pain issues, if any:  no  SAFETY ISSUES:  Prior radiation? no  Pacemaker/ICD? yes  Possible current pregnancy? na  Is the patient on methotrexate? no  Current Complaints / other details:  Living at [facility] after recent falls at home, wheelchair, left sided weakness s/p stroke, brace on left leg married

## 2012-07-17 NOTE — Telephone Encounter (Signed)
Patient is followed by Dr. Donnie Aho, [facility] @ Dr. Marinell Blight aware.

## 2012-07-17 NOTE — Telephone Encounter (Signed)
New Prob      Needs to speak to device regarding pt pacemaker.

## 2012-07-18 ENCOUNTER — Encounter: Payer: Self-pay | Admitting: Radiation Oncology

## 2012-07-18 ENCOUNTER — Ambulatory Visit
Admission: RE | Admit: 2012-07-18 | Discharge: 2012-07-18 | Disposition: A | Discharge status: Home / Self Care | Payer: Medicare Other | Source: Ambulatory Visit | Attending: Radiation Oncology | Admitting: Radiation Oncology

## 2012-07-18 DIAGNOSIS — Z79899 Other long term (current) drug therapy: Secondary | ICD-10-CM | POA: Insufficient documentation

## 2012-07-18 DIAGNOSIS — I10 Essential (primary) hypertension: Secondary | ICD-10-CM | POA: Insufficient documentation

## 2012-07-18 DIAGNOSIS — I69959 Hemiplegia and hemiparesis following unspecified cerebrovascular disease affecting unspecified side: Secondary | ICD-10-CM | POA: Insufficient documentation

## 2012-07-18 DIAGNOSIS — C3410 Malignant neoplasm of upper lobe, unspecified bronchus or lung: Secondary | ICD-10-CM | POA: Insufficient documentation

## 2012-07-18 DIAGNOSIS — Z9181 History of falling: Secondary | ICD-10-CM | POA: Insufficient documentation

## 2012-07-18 DIAGNOSIS — I509 Heart failure, unspecified: Secondary | ICD-10-CM | POA: Insufficient documentation

## 2012-07-18 DIAGNOSIS — Z950 Presence of cardiac pacemaker: Secondary | ICD-10-CM | POA: Insufficient documentation

## 2012-07-18 DIAGNOSIS — E785 Hyperlipidemia, unspecified: Secondary | ICD-10-CM | POA: Insufficient documentation

## 2012-07-18 DIAGNOSIS — I5022 Chronic systolic (congestive) heart failure: Secondary | ICD-10-CM | POA: Insufficient documentation

## 2012-07-18 DIAGNOSIS — Z794 Long term (current) use of insulin: Secondary | ICD-10-CM | POA: Insufficient documentation

## 2012-07-18 DIAGNOSIS — E119 Type 2 diabetes mellitus without complications: Secondary | ICD-10-CM | POA: Insufficient documentation

## 2012-07-18 HISTORY — DX: Malignant neoplasm of unspecified part of unspecified bronchus or lung: C34.90

## 2012-07-18 NOTE — Progress Notes (Signed)
Please see the Nurse Progress Note in the MD Initial Consult Encounter for this patient. 

## 2012-07-18 NOTE — Progress Notes (Signed)
Radiation Oncology         [phone] ________________________________  Initial outpatient Consultation  Name: [name] MRN: [id number]  Date: 07/18/2012  DOB: Apr 12, 1928  WU:JWJXBJ,YNWG A, MD  Delight Ovens, MD , South,Steve, MD  REFERRING PHYSICIAN: Delight Ovens, MD  DIAGNOSIS: Clinical stage I-A squamous cell carcinoma of the lung (cT1b, N0, M0)  HISTORY OF PRESENT ILLNESS::[name] is a 79 y.o. male who is seen out of the courtesy of Dr. Tyrone Sage for an opinion concerning radiation therapy for what appears to be early stage non-small cell lung cancer.  Approximately two months ago while at home he fell in the kitchen and suffered fractured ribs on the left and was seen [facility].   CT scan of the chest abdomen and a PET scan were performed because of the finding of 2.5 cm  nodule in the left lung. Previous scan in 2012 showed this lung nodule to be approximately 1 cm in size. No workup was done at that time. The patient is currently staying in [facility] because of his overall medical condition, but he plans to return home. He is undergoing physical therapy at this facility.  He is limited both by his current age and also long-term disability since a right hemispheric stroke in 1989 with resulting left-sided weakness.    PREVIOUS RADIATION THERAPY: No  PAST MEDICAL HISTORY:  has a past medical history of Diabetes mellitus; Chronic shoulder pain; Hypertension; Hyperlipidemia; Atrial fibrillation; Atrial flutter; Tachycardia-bradycardia; Bradycardia; GI bleeding; Frequent falls; BPH (benign prostatic hypertrophy); Asthma; COPD (chronic obstructive pulmonary disease); Chronic constipation; Spinal stenosis; Peripheral neuropathy; Hard of hearing; Post-traumatic stress; Osteoarthritis; Arm fracture, left; War injury due to shrapnel; Fall against object (06/02/2012); Multiple closed fractures of ribs of right side (06/02/2012); Chronic systolic CHF  (congestive heart failure) (09/2012); Hypertension; Hyperlipidemia; Diabetes mellitus, type 2; A-fib; BPH (benign prostatic hyperplasia); Lung cancer; and Stroke (1997).    PAST SURGICAL HISTORY: Past Surgical History  Procedure Laterality Date  . Insert / replace / remove pacemaker  08-16-2010    left upper chest  . Knee surgery    . Ivc filter    . Cardioversion      2005  . Arm surgery      FAMILY HISTORY: family history includes Diabetes in his brother and mother; Heart attack in his father; and Peripheral vascular disease in his mother.  SOCIAL HISTORY:  reports that he quit smoking about 18 years ago. He has never used smokeless tobacco. He reports that he does not drink alcohol or use illicit drugs.  ALLERGIES: Beta adrenergic blockers; Digoxin and related; and Zocor  MEDICATIONS:  Current Outpatient Prescriptions  Medication Sig Dispense Refill  . acetaminophen (TYLENOL) 325 MG tablet Take 650 mg by mouth every 6 (six) hours as needed for pain or fever.       [name] amiodarone (PACERONE) 200 MG tablet Take 200 mg by mouth every morning.       [name] amLODipine (NORVASC) 5 MG tablet Take 5 mg by mouth every morning.       [name] aspirin 81 MG chewable tablet Chew 81 mg by mouth every morning.      . calcium-vitamin D (OSCAL WITH D) 500-200 MG-UNIT per tablet Take 1 tablet by mouth every morning.       . Cholecalciferol (VITAMIN D3) 1000 UNITS CAPS Take 1,000 Units by mouth every morning.       . Colesevelam HCl (WELCHOL) 3.75 G  PACK Take 3.75 g by mouth daily with supper. One packet with food with largest meal of day      . doxazosin (CARDURA) 2 MG tablet Take 2 mg by mouth at bedtime.        [name] ezetimibe (ZETIA) 10 MG tablet Take 10 mg by mouth every morning.       . finasteride (PROSCAR) 5 MG tablet Take 5 mg by mouth every morning.       . furosemide (LASIX) 40 MG tablet Take 40 mg by mouth 2 (two) times daily.        . hydrALAZINE (APRESOLINE) 25 MG tablet Take 25 mg by mouth 3 (three)  times daily.        [name] HYDROcodone-acetaminophen (NORCO/VICODIN) 5-325 MG per tablet Take 1 tablet by mouth every 4 (four) hours as needed.  60 tablet  0  . lansoprazole (PREVACID) 30 MG capsule Take 30 mg by mouth every morning.      . niacin (NIASPAN) 500 MG CR tablet Take 500 mg by mouth at bedtime.       . potassium chloride SA (K-DUR,KLOR-CON) 20 MEQ tablet Take 20 mEq by mouth every morning.       . ramipril (ALTACE) 10 MG capsule Take 10 mg by mouth 2 (two) times daily.        . traZODone (DESYREL) 50 MG tablet Take 50 mg by mouth at bedtime.        [name] atorvastatin (LIPITOR) 10 MG tablet Take 10 mg by mouth every evening.       . benzonatate (TESSALON) 100 MG capsule Take 1 capsule (100 mg total) by mouth 3 (three) times daily.  21 capsule  0  . insulin lispro (HUMALOG) 100 UNIT/ML injection Inject 1-9 Units into the skin 2 (two) times daily with a meal. 120-150 = 1 unit; 151-200 = 2 units; 201-250 = 3 units; 250-300 = 5 units; 301-350 = 7 units; 351-400 = 9 units       No current facility-administered medications for this encounter.    REVIEW OF SYSTEMS:  A 15 point review of systems is documented in the electronic medical record. This was obtained by the nursing staff. However, I reviewed this with the patient to discuss relevant findings and make appropriate changes.  He denies any pain in the chest area cough or hemoptysis. Patient denies any new bony pain headaches dizziness or blurred vision.   PHYSICAL EXAM:  height is 6\' 1"  (1.854 m). His oral temperature is 98.7 F (37.1 C). His blood pressure is 127/60 and his pulse is 60. His respiration is 20 and oxygen saturation is 97%.   BP 127/60  Pulse 60  Temp(Src) 98.7 F (37.1 C) (Oral)  Resp 20  Ht 6\' 1"  (1.854 m)  SpO2 97%  General Appearance:    Alert, cooperative, no distress, appears stated age,  accompanied by his wife on evaluation today,  hard of hearing   Head:    Normocephalic, without obvious abnormality, atraumatic    Eyes:    PERRL, conjunctiva/corneas clear, EOM's intact, periorbital edema noted             Nose:   Nares normal, septum midline, mucosa normal, no drainage    or sinus tenderness  Throat:   Lips, mucosa, and tongue normal; edentulous   Neck:   Supple, symmetrical, trachea midline, no adenopathy;       thyroid:  No enlargement/tenderness/nodules;    Back:     Symmetric,  no curvature, ROM normal, no CVA tenderness  Lungs:     Clear to auscultation bilaterally, respirations unlabored  Chest wall:    No tenderness or deformity,  pacemaker in the far left upper chest region   Heart:    Regular rate and rhythm,     Abdomen:     Soft, non-tender, bowel sounds active all four quadrants,    no masses, no organomegaly        Extremities:   Extremities normal, atraumatic, no cyanosis or edema, brace along the left ankle to 8 in walking   Pulses:   2+ and symmetric all extremities  Skin:   Skin color, texture, turgor normal, no rashes or lesions  Lymph nodes:   Cervical, supraclavicular, and axillary nodes normal  Neurologic:   left-sided weakness from prior stroke,  diminished grip strength on the left side,         LABORATORY DATA:  Lab Results  Component Value Date   WBC 4.1 07/09/2012   HGB 14.5 07/09/2012   HCT 43.4 07/09/2012   MCV 94.1 07/09/2012   PLT 131* 07/09/2012   Lab Results  Component Value Date   NA 140 06/03/2012   K 4.2 06/03/2012   CL 105 06/03/2012   CO2 27 06/03/2012   Lab Results  Component Value Date   ALT 9 06/03/2012   AST 16 06/03/2012   ALKPHOS 54 06/03/2012   BILITOT 0.6 06/03/2012     RADIOGRAPHY: Nm Pet Image Restag (ps) Skull Base To Thigh  06/21/2012   *RADIOLOGY REPORT*  Clinical Data: Initial treatment strategy for left upper lobe lung nodule.  NUCLEAR MEDICINE PET SKULL BASE TO THIGH  Fasting Blood Glucose:  174  Technique:  15.6 mCi F-18 FDG was injected intravenously. CT data was obtained and used for attenuation correction and anatomic localization only.   (This was not acquired as a diagnostic CT examination.) Additional exam technical data entered on technologist worksheet.  Comparison:  CTA chest dated 06/02/2012.  CT abdomen dated 06/03/2012.  Findings:  Neck: No hypermetabolic lymph nodes in the neck.  Chest:  2.1 x 2.5 cm spiculated lingular nodule (series 2/image 83), max SUV 10.5, compatible with primary bronchogenic neoplasm.  Underlying emphysematous changes.  Small left pleural effusion, increased.  Mild focal hypermetabolism in the left hilar region, equivocal, max SUV 5.7 (PET image 85).  No underlying hilar node is evident on prior CTA chest.  Left subclavian ICD.  Coronary atherosclerosis.  Atherosclerotic calcifications of the aortic arch.  Abdomen/Pelvis:  Motion degraded images.  No abnormal hypermetabolic activity within the liver, pancreas, adrenal glands, or spleen.  No hypermetabolic lymph nodes in the abdomen or pelvis.  Left renal cysts, better evaluated on prior CT.  3.7 x 4.0 cm infrarenal abdominal aortic aneurysm.  Extensive vascular calcifications.  Prostatomegaly which indents the base of the bladder.  Skeleton:  Heterogeneous FDG uptake in multiple thoracic vertebral bodies and the inferior sternum, max SUV 5.9 (image 92).  No corresponding focal abnormality on CT.  Left lateral 6th and 7th rib fractures, max SUV 5.4 (PET image 87).  IMPRESSION: 2.5 cm spiculated lingular nodule, max SUV 10.5, compatible with primary bronchogenic neoplasm.  No definite findings of metastatic disease.  Mild hypermetabolism in the left hilum, max SUV 5.7, but without underlying hilar node on prior CTA chest.  Heterogeneous FDG uptake within multiple thoracic vertebral bodies and the inferior sternum, max SUV 5.9, without corresponding focal abnormality on CT.  Left lateral 6th and 7th  rib fractures.   Original Report Authenticated By: Charline Bills, M.D.   Ct Biopsy  07/09/2012   *RADIOLOGY REPORT*  Indication: Indeterminate but hypermetabolic left  upper lobe pulmonary nodule worrisome for bronchogenic carcinoma.  CT GUIDED LEFT UPPER LOBE NODULE CORE NEEDLE BIOPSY  Comparisons: PET CT - 06/21/2012; Chest CT - 06/02/2012  Intravenous medications: Fentanyl 25 mcg IV; Versed 0.5 mg IV  Contrast: None  Sedation time: 26 minutes  CT Fluoroscopy time: 18 seconds  Complications: None immediate  TECHNIQUE/FINDINGS:  Informed consent was obtained from the patient following an explanation of the procedure, risks, benefits and alternatives. The patient understands, agrees and consents for the procedure. All questions were addressed.  A time out was performed prior to the initiation of the procedure.  The patient was positioned supine on the CT table and a limited chest CT was performed for procedural planning demonstrating grossly unchanged appearance of slightly spiculated approximately 1.8 x 2.4 cm nodule within the left upper lobe adjacent to the left major fissure (image 23, series 3).  Incidental note of is made of interval healing of the lateral aspect of the left 4th through 8th ribs.  The operative site was prepped and draped in the usual sterile fashion.  Under sterile conditions and local anesthesia, a 17 gauge coaxial needle was advanced into the peripheral aspect of the nodule.  (Note, care was taken to manipulate the coaxial needle around the callous formation surrounding the nondisplaced left- sided sixth rib fracture.)  Positioning was confirmed with intermittent CT fluoroscopy and 2 core needle biopsies were obtained with an 18 gauge core needle biopsy device.  Limited post procedural chest CT demonstrated a very minimal amount of expected post-biopsy hemorrhage but was negative for pneumothorax or additional complication.  The co-axial needle was removed and hemostasis was achieved with manual compression.  A dressing was placed.  The patient tolerated the procedure well without immediate postprocedural complication.  The patient was escorted to have an  upright chest radiograph.  IMPRESSION:  Technically successful CT guided core needle biopsy of hypermetabolic nodule within the left upper lobe.   Original Report Authenticated By: Tacey Ruiz, MD     IMPRESSION: Clinical stage I-A squamous cell carcinoma of the left lung (cT1b, N0, M0).  The patient is not felt to be a good candidate for surgery given his medical issues as above. He would be a good candidate for stereotactic body radiotherapy (SBRT).  The patient's case was presented at the multidisciplinary thoracic oncology clinic and the patient's PET scan was reviewed. The questionable activity in the left hilar area was not felt to be of significance the patient's pacemaker location was reviewed and it would appear that his lesion within the lingula area is inferior to the pacemaker location. Pending cardiology evaluation I do not feel the patient would require moving his pacemaker for treatment.  PLAN: Simulation and planning on 07/23/2012.  I anticipate between 3 and 5 SBRT treatments.  I spent 60 minutes minutes face to face with the patient and more than 50% of that time was spent in counseling and/or coordination of care.   ------------------------------------------------   Billie Lade, PhD, MD

## 2012-07-18 NOTE — Progress Notes (Signed)
Pt resides at [facility] since recent falls at home.

## 2012-07-23 ENCOUNTER — Ambulatory Visit: Payer: Medicare Other | Admitting: Radiation Oncology

## 2012-07-23 ENCOUNTER — Telehealth: Payer: Self-pay | Admitting: *Deleted

## 2012-07-23 NOTE — Telephone Encounter (Signed)
Per Clydie Braun, RN for Dr Donnie Aho, cardiologist, pt's pacemaker must be interrogated in Dr York Spaniel office prior to ct sim and radiation treatment initiation. Informed Dr Roselind Messier, ct sim cancelled per Dr Roselind Messier. Jehnna RT in ct sim dept notified per phone. Called [facility], [phone], spoke w/Janet, Licensed conveyancer and informed her of above situation. She will cancel pt's transportation to cancer center today. She states she has been notified by pt's wife to schedule pt an appointment to see Dr Donnie Aho. Informed her that this office will call back when pt's ct sim has been rescheduled which will be following pacemaker interrogation and this office's receipt of completed pacemaker form from Dr Donnie Aho. She states to speak to her or Larita Fife at [phone].

## 2012-07-24 NOTE — Addendum Note (Signed)
Encounter addended by: Glennie Hawk, RN on: 07/24/2012  9:59 AM<BR>     Documentation filed: Charges VN

## 2012-07-26 ENCOUNTER — Encounter: Payer: Self-pay | Admitting: Cardiology

## 2012-07-26 ENCOUNTER — Telehealth: Payer: Self-pay | Admitting: *Deleted

## 2012-07-26 NOTE — Telephone Encounter (Signed)
Called Jana Half, [employer] rep to inform her of Dr Roselind Messier pt, [name], DOB 08/25/1928 who will have ct sim scheduled next week. Pt has pacemaker in left chest. Per Dr Donnie Aho, cardiologist, pt will have to have [employer] rep present when he receives his first radiation treatment. Informed Tarry Kos that pt's treatment will not begin until 2-3 days following his ct sim; will call her as soon as pt's radiation treatments have been scheduled. Tarry Kos verbalized understanding.
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
## 2012-08-07 ENCOUNTER — Telehealth: Payer: Self-pay | Admitting: *Deleted

## 2012-08-07 NOTE — Telephone Encounter (Signed)
Spoke w/Marcia Madilyn Fireman, [employer] representative [phone] and informed her pt is scheduled for CT sim on 08/08/12 and that per Dr York Spaniel e mail dated 07/26/12, pt will need [employer] rep for his first radiation treatment. Informed Tarry Kos that this RN will call her w/date/time of pt's first radiation treatment. She verbalized understanding. Arlys John RT in ct sim dept notified that [employer] representative is aware of pt's ct sim tomorrow. Dr Stacie Glaze notified of above information.

## 2012-08-08 ENCOUNTER — Ambulatory Visit
Admission: RE | Admit: 2012-08-08 | Discharge: 2012-08-08 | Disposition: A | Discharge status: Home / Self Care | Payer: Medicare Other | Source: Ambulatory Visit | Attending: Radiation Oncology | Admitting: Radiation Oncology

## 2012-08-08 DIAGNOSIS — Z510 Encounter for antineoplastic radiation therapy: Secondary | ICD-10-CM | POA: Insufficient documentation

## 2012-08-08 DIAGNOSIS — C3490 Malignant neoplasm of unspecified part of unspecified bronchus or lung: Secondary | ICD-10-CM | POA: Insufficient documentation

## 2012-08-08 DIAGNOSIS — Z79899 Other long term (current) drug therapy: Secondary | ICD-10-CM | POA: Insufficient documentation

## 2012-08-08 NOTE — Progress Notes (Signed)
Pt here from [facility]; wife to accompany pt today. Will see Dr Roselind Messier prior to ct sim today. Pt denies pain.

## 2012-08-08 NOTE — Progress Notes (Signed)
[facility] Radiation Oncology Simulation and Treatment Planning Note   Name:  Sandip, Power MRN: [id number]   Date: 08/08/2012  DOB: 06/24/1928  Status:outpatient    DIAGNOSIS: Clinical stage I non-small cell lung cancer of the Left Lung   CONSENT VERIFIED:yes   SET UP: Patient is setup supine   IMMOBILIZATION: The patient was immobilized using a Vac Loc bag and Abdominal Compression.   NARRATIVE:The patient was brought to the CT Simulation planning suite.  Identity was confirmed.  All relevant records and images related to the planned course of therapy were reviewed.  Then, the patient was positioned in a stable reproducible clinical set-up for radiation therapy. Abdominal compression was applied by additional abdominal bag.  4D CT images were obtained and reproducible breathing pattern was confirmed. Free breathing CT images were obtained.  Skin markings were placed.  The CT images were loaded into the planning software where the target and avoidance structures were contoured.  The radiation prescription was entered and confirmed.    TREATMENT PLANNING NOTE:  Treatment planning then occurred. I have requested : MLC's, isodose plan, basic dose calculation.  Plan: patient will receive between 3 and 5 SBRT treatments pending on the overall treatment plan. 3 dimensional simulation is performed and dose volume histogram of the gross tumor volume, planning tumor volume and criticial normal structures including the spinal cord and lungs were analyzed and requested.  Special treatment procedure was performed due to high dose per fraction.  The patient will be monitored for increased risk of toxicity.  Daily imaging using cone beam CT will be used for target localization.  -----------------------------------  Billie Lade, PhD, MD

## 2012-08-08 NOTE — Progress Notes (Signed)
  Radiation Oncology         (336) [phone] ________________________________  Name: [name] MRN: [id number]  Date: 08/08/2012  DOB: 1928-05-27  RESPIRATORY MOTION MANAGEMENT SIMULATION  NARRATIVE:  In order to account for effect of respiratory motion on target structures and other organs in the planning and delivery of radiotherapy, this patient underwent respiratory motion management simulation.  To accomplish this, when the patient was brought to the CT simulation planning suite, 4D respiratoy motion management CT images were obtained.  The CT images were loaded into the planning software.  Then, using a variety of tools including Cine, MIP, and standard views, the target volume and planning target volumes (PTV) were delineated.  Avoidance structures were contoured.  Treatment planning then occurred.  Dose volume histograms were generated and reviewed for each of the requested structure.  The resulting plan was carefully reviewed and approved today.  -----------------------------------  Billie Lade, PhD, MD

## 2012-08-09 ENCOUNTER — Telehealth: Payer: Self-pay | Admitting: *Deleted

## 2012-08-09 NOTE — Telephone Encounter (Signed)
Spoke w/Marcia Madilyn Fireman, [employer] representative [phone] and informed her of patient's SBRT radiation treatments. She was given all three dates and times of treatment, length of each treatment. She was informed that pt is expected to arrive 15-20 min early for each treatment. This information was repeated to Jana Half who verbalized understanding.  Irving Burton RT, Linac #1 notified of above information. Informed Irving Burton that patient must be monitored for his first treatment, and possibly for all subsequent treatments. Irving Burton RT verbalized understanding.

## 2012-08-20 ENCOUNTER — Ambulatory Visit
Admission: RE | Admit: 2012-08-20 | Discharge: 2012-08-20 | Disposition: A | Discharge status: Home / Self Care | Payer: Medicare Other | Source: Ambulatory Visit | Attending: Radiation Oncology | Admitting: Radiation Oncology

## 2012-08-20 ENCOUNTER — Encounter: Payer: Self-pay | Admitting: Radiation Oncology

## 2012-08-20 ENCOUNTER — Telehealth: Payer: Self-pay | Admitting: *Deleted

## 2012-08-20 NOTE — Progress Notes (Signed)
  Radiation Oncology         (336) [phone] ________________________________  Name: [name] MRN: [id number]  Date: 08/20/2012  DOB: [birth year]  Stereotactic Body Radiotherapy Treatment Procedure Note  NARRATIVE:  [name] was brought to the stereotactic radiation treatment machine and placed supine on the CT couch. The patient was set up for stereotactic body radiotherapy on the body fix pillow.  3D TREATMENT PLANNING AND DOSIMETRY:  The patient's radiation plan was reviewed and approved prior to starting treatment.  It showed 3-dimensional radiation distributions overlaid onto the planning CT.  The [facility] for the target structures as well as the organs at risk were reviewed. The documentation of this is filed in the radiation oncology EMR.  SIMULATION VERIFICATION:  The patient underwent CT imaging on the treatment unit.  These were carefully aligned to document that the ablative radiation dose would cover the target volume and maximally spare the nearby organs at risk according to the planned distribution.  SPECIAL TREATMENT PROCEDURE: [name] received high dose ablative stereotactic body radiotherapy to the planned target volume without unforeseen complications. Treatment was delivered uneventfully. The high doses associated with stereotactic body radiotherapy and the significant potential risks require careful treatment set up and patient monitoring constituting a special treatment procedure   STEREOTACTIC TREATMENT MANAGEMENT:  Following delivery, the patient was evaluated clinically. The patient tolerated treatment without significant acute effects, and was discharged to home in stable condition.    PLAN: Continue treatment as planned.  ________________________________  Billie Lade, PhD, MD

## 2012-08-20 NOTE — Progress Notes (Signed)
Pt denies pain, loss of appetite; he does report dry cough, some SOB, fatigue. Pt still resides in rehab facility. Pt completed 1 of 3 planned SBRT to left lung.

## 2012-08-20 NOTE — Progress Notes (Signed)
[facility]    Radiation Oncology [address] [place]     Maryln Gottron, M.D. [place], [place] [address]               Billie Lade, M.D., Ph.D. Phone: [phone]      Molli Hazard A. Kathrynn Running, M.D. Fax: [phone]      Radene Gunning, M.D., Ph.D.         Lurline Hare, M.D.         Grayland Jack, M.D Weekly Treatment Management Note  Name: [name]     MRN: [id number]        CSN: [id number] Date: 08/20/2012      DOB: 1928-07-15  CC: Kirk Kayser, MD         [place]    Status: Outpatient  Diagnosis: The encounter diagnosis was Lung cancer, left.  Current Dose: 18 Gy  Current Fraction: 1 of 3  Planned Dose: 54 Gy  Narrative: [name] was seen today for weekly treatment management. The chart was checked and CBCT  were reviewed. He is tolerating his SBRT well.  Beta adrenergic blockers; Digoxin and related; and Zocor  Current Outpatient Prescriptions  Medication Sig Dispense Refill  . acetaminophen (TYLENOL) 325 MG tablet Take 650 mg by mouth every 6 (six) hours as needed for pain or fever.       [name] amiodarone (PACERONE) 200 MG tablet Take 200 mg by mouth every morning.       [name] amLODipine (NORVASC) 5 MG tablet Take 5 mg by mouth every morning.       [name] aspirin 81 MG chewable tablet Chew 81 mg by mouth every morning.      [name] atorvastatin (LIPITOR) 10 MG tablet Take 10 mg by mouth every evening.       . benzonatate (TESSALON) 100 MG capsule Take 1 capsule (100 mg total) by mouth 3 (three) times daily.  21 capsule  0  . calcium-vitamin D (OSCAL WITH D) 500-200 MG-UNIT per tablet Take 1 tablet by mouth every morning.       . Cholecalciferol (VITAMIN D3) 1000 UNITS CAPS Take 1,000 Units by mouth every morning.       . Colesevelam HCl (WELCHOL) 3.75 G PACK Take 3.75 g by mouth daily with supper. One packet with food with largest meal of day      . doxazosin (CARDURA) 2 MG tablet Take 2 mg by mouth at bedtime.        [name] ezetimibe (ZETIA) 10 MG tablet Take  10 mg by mouth every morning.       . finasteride (PROSCAR) 5 MG tablet Take 5 mg by mouth every morning.       . furosemide (LASIX) 40 MG tablet Take 40 mg by mouth 2 (two) times daily.        . hydrALAZINE (APRESOLINE) 25 MG tablet Take 25 mg by mouth 3 (three) times daily.        [name] HYDROcodone-acetaminophen (NORCO/VICODIN) 5-325 MG per tablet Take 1 tablet by mouth every 4 (four) hours as needed.  60 tablet  0  . insulin lispro (HUMALOG) 100 UNIT/ML injection Inject 1-9 Units into the skin 2 (two) times daily with a meal. 120-150 = 1 unit; 151-200 = 2 units; 201-250 = 3 units; 250-300 = 5 units; 301-350 = 7 units; 351-400 = 9 units      . lansoprazole (PREVACID) 30 MG capsule Take 30 mg by mouth every morning.      [name]  niacin (NIASPAN) 500 MG CR tablet Take 500 mg by mouth at bedtime.       . potassium chloride SA (K-DUR,KLOR-CON) 20 MEQ tablet Take 20 mEq by mouth every morning.       . ramipril (ALTACE) 10 MG capsule Take 10 mg by mouth 2 (two) times daily.        . traZODone (DESYREL) 50 MG tablet Take 50 mg by mouth at bedtime.         No current facility-administered medications for this encounter.   Labs:    Physical Examination:  oral temperature is 97.5 F (36.4 C). His blood pressure is 168/71 and his pulse is 62. His respiration is 20 and oxygen saturation is 96%.    Wt Readings from Last 3 Encounters:  07/09/12 208 lb (94.348 kg)  07/02/12 208 lb (94.348 kg)  06/05/12 208 lb 5.4 oz (94.5 kg)     Lungs - Normal respiratory effort, chest expands symmetrically. Lungs are clear to auscultation, no crackles or wheezes.  Heart has regular rhythm and rate  Abdomen is soft and non tender with normal bowel sounds  Assessment:  Patient tolerating treatments well  Plan: Continue treatment per original radiation prescription

## 2012-08-20 NOTE — Telephone Encounter (Signed)
Spoke w/Marcia Madilyn Fireman, Ship broker. Reminded her of pt's first SBRT treatment today at 1:15 pm. Advised she arrive at 1pm today. Gave her directions to radiation oncology dept and linac 1. Ms Madilyn Fireman verbalized understanding. Her contact [phone]. Katie, RT linac 1 notified that Ms Madilyn Fireman must be present for pt's treatment today to monitor his pacemaker. Katie verbalized understanding.

## 2012-08-22 ENCOUNTER — Encounter: Payer: Self-pay | Admitting: Radiation Oncology

## 2012-08-22 ENCOUNTER — Ambulatory Visit
Admission: RE | Admit: 2012-08-22 | Discharge: 2012-08-22 | Disposition: A | Discharge status: Home / Self Care | Payer: Medicare Other | Source: Ambulatory Visit | Attending: Radiation Oncology | Admitting: Radiation Oncology

## 2012-08-27 ENCOUNTER — Ambulatory Visit: Payer: Medicare Other | Admitting: Radiation Oncology

## 2012-08-28 ENCOUNTER — Encounter: Payer: Self-pay | Admitting: Radiation Oncology

## 2012-08-28 ENCOUNTER — Ambulatory Visit
Admission: RE | Admit: 2012-08-28 | Discharge: 2012-08-28 | Disposition: A | Discharge status: Home / Self Care | Payer: Medicare Other | Source: Ambulatory Visit | Attending: Radiation Oncology | Admitting: Radiation Oncology

## 2012-08-31 NOTE — Progress Notes (Signed)
  Radiation Oncology         (336) [phone] ________________________________  Name: NAYSHAWN MESTA MRN: [id number]  Date: 08/22/2012  DOB: 21-Nov-1928  Stereotactic Body Radiotherapy Treatment Procedure Note  NARRATIVE:  AQUIL DUHE was brought to the stereotactic radiation treatment machine for his second fraction of radiosurgery and placed supine on the CT couch. The patient was set up for stereotactic body radiotherapy on the body fix pillow.  3D TREATMENT PLANNING AND DOSIMETRY:  The patient's radiation plan was reviewed and approved prior to starting treatment.  It showed 3-dimensional radiation distributions overlaid onto the planning CT.  The [facility] for the target structures as well as the organs at risk were reviewed. The documentation of this is filed in the radiation oncology EMR.  SIMULATION VERIFICATION:  The patient underwent CT imaging on the treatment unit.  These were carefully aligned to document that the ablative radiation dose would cover the target volume and maximally spare the nearby organs at risk according to the planned distribution.  SPECIAL TREATMENT PROCEDURE: [name] received high dose ablative stereotactic body radiotherapy to the planned target volume without unforeseen complications. Treatment was delivered uneventfully. The high doses associated with stereotactic body radiotherapy and the significant potential risks require careful treatment set up and patient monitoring constituting a special treatment procedure   STEREOTACTIC TREATMENT MANAGEMENT:  Following delivery, the patient was evaluated clinically. The patient tolerated treatment without significant acute effects, and was discharged to home in stable condition.    PLAN: Continue treatment as planned.

## 2012-08-31 NOTE — Progress Notes (Signed)
  Radiation Oncology         [phone]) [phone] ________________________________  Name: [name] MRN: [id number]  Date: 08/28/2012  DOB: Nov 06, 1928  Stereotactic Body Radiotherapy Treatment Procedure Note  NARRATIVE:  [name] was brought to the stereotactic radiation treatment machine and placed supine on the CT couch. The patient was set up for stereotactic body radiotherapy on the body fix pillow.  3D TREATMENT PLANNING AND DOSIMETRY:  The patient's radiation plan was reviewed and approved prior to starting treatment.  It showed 3-dimensional radiation distributions overlaid onto the planning CT.  The [facility] for the target structures as well as the organs at risk were reviewed. The documentation of this is filed in the radiation oncology EMR.  SIMULATION VERIFICATION:  The patient underwent CT imaging on the treatment unit.  These were carefully aligned to document that the ablative radiation dose would cover the target volume and maximally spare the nearby organs at risk according to the planned distribution.  SPECIAL TREATMENT PROCEDURE: [name] received high dose ablative stereotactic body radiotherapy to the planned target volume without unforeseen complications. Treatment was delivered uneventfully. The high doses associated with stereotactic body radiotherapy and the significant potential risks require careful treatment set up and patient monitoring constituting a special treatment procedure   STEREOTACTIC TREATMENT MANAGEMENT:  Following delivery, the patient was evaluated clinically. The patient tolerated treatment without significant acute effects, and was discharged to home in stable condition.    PLAN: Continue treatment as planned.

## 2012-09-04 ENCOUNTER — Encounter: Payer: Self-pay | Admitting: Radiation Oncology

## 2012-09-04 NOTE — Progress Notes (Signed)
  Radiation Oncology         (336) [phone] ________________________________  Name: [name] MRN: [id number]  Date: 09/04/2012  DOB: [birth year]  End of Treatment Note  Diagnosis:     Clinical stage I non-small cell lung cancer of the Left Lung   Indication for treatment:  Definitive treatment       Radiation treatment dates:   June 24, June 26, July 2  Site/dose:   Left upper lung nodule, 54 gray in 3 fractions  Beams/energy:   The patient was treated with SBRT treatment techniques  Narrative: The patient tolerated radiation treatment relatively well.   He had some mild fatigue as he progressed thorough the treatments.  Plan: The patient has completed radiation treatment. The patient will return to radiation oncology clinic for routine followup in one month. I advised them to call or return sooner if they have any questions or concerns related to their recovery or treatment.  -----------------------------------  Billie Lade, PhD, MD

## 2012-09-20 ENCOUNTER — Encounter (INDEPENDENT_AMBULATORY_CARE_PROVIDER_SITE_OTHER): Payer: Medicare Other | Admitting: *Deleted

## 2012-09-20 DIAGNOSIS — I714 Abdominal aortic aneurysm, without rupture: Secondary | ICD-10-CM

## 2012-09-24 ENCOUNTER — Encounter: Payer: Self-pay | Admitting: Internal Medicine

## 2012-09-27 DIAGNOSIS — I5022 Chronic systolic (congestive) heart failure: Secondary | ICD-10-CM

## 2012-09-27 HISTORY — DX: Chronic systolic (congestive) heart failure: I50.22

## 2012-10-02 ENCOUNTER — Other Ambulatory Visit: Payer: Self-pay

## 2012-10-08 ENCOUNTER — Telehealth: Payer: Self-pay | Admitting: *Deleted

## 2012-10-09 ENCOUNTER — Encounter: Payer: Self-pay | Admitting: Radiation Oncology

## 2012-10-09 DIAGNOSIS — Z923 Personal history of irradiation: Secondary | ICD-10-CM | POA: Insufficient documentation

## 2012-10-10 ENCOUNTER — Encounter: Payer: Self-pay | Admitting: Radiation Oncology

## 2012-10-10 ENCOUNTER — Ambulatory Visit
Admission: RE | Admit: 2012-10-10 | Discharge: 2012-10-10 | Disposition: A | Discharge status: Home / Self Care | Payer: Medicare Other | Source: Ambulatory Visit | Attending: Radiation Oncology | Admitting: Radiation Oncology

## 2012-10-10 NOTE — Progress Notes (Signed)
Pt denies pain, SOB, reports dry cough. Pt continues to reside at [facility]. Pt 's wife states she has recently asked that pt's diet be changed to soft due to pt not having any dentures. She states she was told pt has lost weight. Pt was unable to stand up for weight today. Advised wife speak w/caregiver at [facility] again today re: pt's diet and weight loss. Wife verbalized understanding.

## 2012-10-10 NOTE — Progress Notes (Signed)
Radiation Oncology         [phone] ________________________________  Name: [name] MRN: [id number]  Date: 10/10/2012  DOB: [birth year]  Follow-Up Visit Note  CC: Ezequiel Kayser, MD  Delight Ovens, MD  Diagnosis:   Clinical stage I non-small cell lung cancer involving the left lung  Interval Since Last Radiation:  43  days  Narrative:  The patient returns today for routine follow-up.  He seems to be doing well this time. He has an occasional mild dry cough. He denies any pain in the chest area or breathing problems.                              ALLERGIES:  is allergic to beta adrenergic blockers; digoxin and related; and zocor.  Meds: Current Outpatient Prescriptions  Medication Sig Dispense Refill  . acetaminophen (TYLENOL) 325 MG tablet Take 650 mg by mouth every 6 (six) hours as needed for pain or fever.       [name] amiodarone (PACERONE) 200 MG tablet Take 200 mg by mouth every morning.       [name] amLODipine (NORVASC) 5 MG tablet Take 5 mg by mouth every morning.       [name] aspirin 81 MG chewable tablet Chew 81 mg by mouth every morning.      [name] atorvastatin (LIPITOR) 10 MG tablet Take 10 mg by mouth every evening.       . benzonatate (TESSALON) 100 MG capsule Take 1 capsule (100 mg total) by mouth 3 (three) times daily.  21 capsule  0  . calcium-vitamin D (OSCAL WITH D) 500-200 MG-UNIT per tablet Take 1 tablet by mouth every morning.       . Cholecalciferol (VITAMIN D3) 1000 UNITS CAPS Take 1,000 Units by mouth every morning.       . Colesevelam HCl (WELCHOL) 3.75 G PACK Take 3.75 g by mouth daily with supper. One packet with food with largest meal of day      . doxazosin (CARDURA) 2 MG tablet Take 2 mg by mouth at bedtime.        [name] ezetimibe (ZETIA) 10 MG tablet Take 10 mg by mouth every morning.       . finasteride (PROSCAR) 5 MG tablet Take 5 mg by mouth every morning.       . furosemide (LASIX) 40 MG tablet Take 40 mg by mouth 2 (two) times daily.        . hydrALAZINE  (APRESOLINE) 25 MG tablet Take 25 mg by mouth 3 (three) times daily.        [name] HYDROcodone-acetaminophen (NORCO/VICODIN) 5-325 MG per tablet Take 1 tablet by mouth every 4 (four) hours as needed.  60 tablet  0  . insulin lispro (HUMALOG) 100 UNIT/ML injection Inject 1-9 Units into the skin 2 (two) times daily with a meal. 120-150 = 1 unit; 151-200 = 2 units; 201-250 = 3 units; 250-300 = 5 units; 301-350 = 7 units; 351-400 = 9 units      . lansoprazole (PREVACID) 30 MG capsule Take 30 mg by mouth every morning.      . niacin (NIASPAN) 500 MG CR tablet Take 500 mg by mouth at bedtime.       . potassium chloride SA (K-DUR,KLOR-CON) 20 MEQ tablet Take 20 mEq by mouth every morning.       . ramipril (ALTACE) 10 MG capsule Take 10 mg by mouth 2 (two)  times daily.        . traZODone (DESYREL) 50 MG tablet Take 50 mg by mouth at bedtime.         No current facility-administered medications for this encounter.    Physical Findings: The patient is in no acute distress. Patient is alert and oriented. Accompanied by his wife  oral temperature is 98.1 F (36.7 C). His blood pressure is 130/62 and his pulse is 62. His respiration is 20 and oxygen saturation is 98%. .  The lungs are clear. The heart has a regular rhythm and rate.      Radiographic Findings: No results found.  Impression:  The patient is recovering from the effects of radiation.    Plan:  Routine followup in 3 months. At that time the patient will be scheduled for a CT scan of the chest to assess his response to stereotactic body radiotherapy treatment.  _____________________________________  -----------------------------------  Billie Lade, PhD, MD

## 2012-10-14 ENCOUNTER — Telehealth: Payer: Self-pay | Admitting: *Deleted

## 2012-10-14 NOTE — Telephone Encounter (Signed)
Per Dr Roselind Messier, called Dr York Spaniel office, cardiology re: pt's pacemaker. Left vm for Clydie Braun, RN requesting call back. Left name and number.  3:48 pm  Called Dr York Spaniel office again, spoke w/Kathy RN. Olegario Messier states she spoke w/pacemaker rep, and they will go to [facility] and test the pt's pacemaker. She states no further action required of this office. Will notify Dr Roselind Messier.

## 2012-10-16 NOTE — Telephone Encounter (Signed)
error 

## 2013-01-09 ENCOUNTER — Ambulatory Visit
Admission: RE | Admit: 2013-01-09 | Discharge: 2013-01-09 | Disposition: A | Discharge status: Home / Self Care | Payer: Medicare Other | Source: Ambulatory Visit | Attending: Radiation Oncology | Admitting: Radiation Oncology

## 2013-01-09 ENCOUNTER — Telehealth: Payer: Self-pay | Admitting: *Deleted

## 2013-01-09 ENCOUNTER — Encounter: Payer: Self-pay | Admitting: Radiation Oncology

## 2013-01-09 DIAGNOSIS — C3490 Malignant neoplasm of unspecified part of unspecified bronchus or lung: Secondary | ICD-10-CM | POA: Insufficient documentation

## 2013-01-09 LAB — BUN AND CREATININE (CC13)
BUN: 15.1 mg/dL (ref 7.0–26.0)
Creatinine: 0.9 mg/dL (ref 0.7–1.3)

## 2013-01-09 NOTE — Telephone Encounter (Signed)
Called patient to inform of test, lvm for a return call 

## 2013-01-09 NOTE — Progress Notes (Signed)
Radiation Oncology         [phone] ________________________________  Name: [name] MRN: [id number]  Date: 01/09/2013  DOB: 1929/01/13  Follow-Up Visit Note  CC: Ezequiel Kayser, MD  Delight Ovens, MD  Diagnosis:   Clinical stage I non-small cell lung cancer of the Left Lung   Interval Since Last Radiation:  4  months  Narrative:  The patient returns today for routine follow-up. He is doing reasonably well at this time. According to his wife he has had a couple of falls getting into the shower but no apparent injury. He denies any pain in the chest area or hemoptysis. He has mild dry cough. He denies any fever.  He continues to reside at [facility]  nursing home.                             ALLERGIES:  is allergic to beta adrenergic blockers; digoxin and related; and zocor.  Meds: Current Outpatient Prescriptions  Medication Sig Dispense Refill  . acetaminophen (TYLENOL) 325 MG tablet Take 650 mg by mouth every 6 (six) hours as needed for pain or fever.       [name] amiodarone (PACERONE) 200 MG tablet Take 200 mg by mouth every morning.       [name] amLODipine (NORVASC) 5 MG tablet Take 5 mg by mouth every morning.       [name] aspirin 81 MG chewable tablet Chew 81 mg by mouth every morning.      [name] atorvastatin (LIPITOR) 10 MG tablet Take 10 mg by mouth every evening.       . benzonatate (TESSALON) 100 MG capsule Take 1 capsule (100 mg total) by mouth 3 (three) times daily.  21 capsule  0  . calcium-vitamin D (OSCAL WITH D) 500-200 MG-UNIT per tablet Take 1 tablet by mouth every morning.       . Cholecalciferol (VITAMIN D3) 1000 UNITS CAPS Take 1,000 Units by mouth every morning.       . Colesevelam HCl (WELCHOL) 3.75 G PACK Take 3.75 g by mouth daily with supper. One packet with food with largest meal of day      . doxazosin (CARDURA) 2 MG tablet Take 2 mg by mouth at bedtime.        [name] ezetimibe (ZETIA) 10 MG tablet Take 10 mg by mouth every morning.       . finasteride  (PROSCAR) 5 MG tablet Take 5 mg by mouth every morning.       . furosemide (LASIX) 40 MG tablet Take 40 mg by mouth 2 (two) times daily.        . hydrALAZINE (APRESOLINE) 25 MG tablet Take 25 mg by mouth 3 (three) times daily.        [name] HYDROcodone-acetaminophen (NORCO/VICODIN) 5-325 MG per tablet Take 1 tablet by mouth every 4 (four) hours as needed.  60 tablet  0  . insulin lispro (HUMALOG) 100 UNIT/ML injection Inject 1-9 Units into the skin 2 (two) times daily with a meal. 120-150 = 1 unit; 151-200 = 2 units; 201-250 = 3 units; 250-300 = 5 units; 301-350 = 7 units; 351-400 = 9 units      . niacin (NIASPAN) 500 MG CR tablet Take 500 mg by mouth at bedtime.       [name] omeprazole (PRILOSEC) 20 MG capsule Take 20 mg by mouth daily.      . potassium chloride SA (K-DUR,KLOR-CON) 20  MEQ tablet Take 20 mEq by mouth every morning.       . ramipril (ALTACE) 10 MG capsule Take 10 mg by mouth 2 (two) times daily.        . traZODone (DESYREL) 50 MG tablet Take 50 mg by mouth at bedtime.         No current facility-administered medications for this encounter.    Physical Findings: Temperature 98.3 oxygen saturation 100% on room air,  pulse 62, blood pressure 140/71 The patient is in no acute distress. Patient is alert and oriented.   No palpable supraclavicular or axillary adenopathy. The lungs are clear to auscultation. The heart has a regular rhythm and rate.  Lab Findings: Lab Results  Component Value Date   WBC 4.1 07/09/2012   HGB 14.5 07/09/2012   HCT 43.4 07/09/2012   MCV 94.1 07/09/2012   PLT 131* 07/09/2012      Radiographic Findings: No results found.  Impression:  No evidence of recurrence on clinical exam today  Plan:  Routine followup in 6 months. Patient will be scheduled for a CT scan of the chest to assess his response to SBRT. This will be performed next few days. He will present to the lab later today for a BUN and creatinine in light of his anticipated IV contrast with his chest CT  scan.  _____________________________________  -----------------------------------  Billie Lade, PhD, MD

## 2013-01-09 NOTE — Progress Notes (Signed)
Bach Rocchi here in a wheelchair with his wife for follow up after treatment to his left lung.  He denies pain.  He reports a frequent, dry cough.  He denies shortness of breath.  His wife reports that he has had 2 falls recently in the bathroom at [facility].

## 2013-01-14 ENCOUNTER — Encounter (HOSPITAL_COMMUNITY): Payer: Self-pay

## 2013-01-14 ENCOUNTER — Ambulatory Visit (HOSPITAL_COMMUNITY)
Admission: RE | Admit: 2013-01-14 | Discharge: 2013-01-14 | Disposition: A | Discharge status: Home / Self Care | Payer: Medicare Other | Source: Ambulatory Visit | Attending: Radiation Oncology | Admitting: Radiation Oncology

## 2013-01-14 DIAGNOSIS — Z923 Personal history of irradiation: Secondary | ICD-10-CM | POA: Insufficient documentation

## 2013-01-14 DIAGNOSIS — N281 Cyst of kidney, acquired: Secondary | ICD-10-CM | POA: Insufficient documentation

## 2013-01-14 DIAGNOSIS — I700 Atherosclerosis of aorta: Secondary | ICD-10-CM | POA: Insufficient documentation

## 2013-01-14 DIAGNOSIS — N62 Hypertrophy of breast: Secondary | ICD-10-CM | POA: Insufficient documentation

## 2013-01-14 DIAGNOSIS — Z950 Presence of cardiac pacemaker: Secondary | ICD-10-CM | POA: Insufficient documentation

## 2013-01-14 DIAGNOSIS — C3490 Malignant neoplasm of unspecified part of unspecified bronchus or lung: Secondary | ICD-10-CM | POA: Insufficient documentation

## 2013-01-14 DIAGNOSIS — R05 Cough: Secondary | ICD-10-CM | POA: Insufficient documentation

## 2013-01-14 DIAGNOSIS — R059 Cough, unspecified: Secondary | ICD-10-CM | POA: Insufficient documentation

## 2013-01-14 DIAGNOSIS — R911 Solitary pulmonary nodule: Secondary | ICD-10-CM | POA: Insufficient documentation

## 2013-01-14 DIAGNOSIS — N2889 Other specified disorders of kidney and ureter: Secondary | ICD-10-CM | POA: Insufficient documentation

## 2013-01-14 MED ORDER — IOHEXOL 300 MG/ML  SOLN
80.0000 mL | Freq: Once | INTRAMUSCULAR | Status: AC | PRN
  Administered 2013-01-14: 80 mL via INTRAVENOUS

## 2013-01-15 ENCOUNTER — Telehealth: Payer: Self-pay | Admitting: Oncology

## 2013-01-15 NOTE — Telephone Encounter (Signed)
Called Jamol Ginyard to let her know that Dayan's chest CT scan results were good with no signs of active cancer and that he had a good response to SBRT per Dr. Roselind Messier.  She was very happy and will tell Keeon.

## 2013-05-23 ENCOUNTER — Emergency Department (HOSPITAL_COMMUNITY): Payer: Medicare Other

## 2013-05-23 ENCOUNTER — Encounter (HOSPITAL_COMMUNITY): Payer: Self-pay | Admitting: Emergency Medicine

## 2013-05-23 ENCOUNTER — Emergency Department (HOSPITAL_COMMUNITY)
Admission: EM | Admit: 2013-05-23 | Discharge: 2013-05-23 | Disposition: A | Discharge status: Home / Self Care | Payer: Medicare Other | Attending: Emergency Medicine | Admitting: Emergency Medicine

## 2013-05-23 DIAGNOSIS — Z8781 Personal history of (healed) traumatic fracture: Secondary | ICD-10-CM | POA: Insufficient documentation

## 2013-05-23 DIAGNOSIS — I4891 Unspecified atrial fibrillation: Secondary | ICD-10-CM | POA: Insufficient documentation

## 2013-05-23 DIAGNOSIS — Z7982 Long term (current) use of aspirin: Secondary | ICD-10-CM | POA: Insufficient documentation

## 2013-05-23 DIAGNOSIS — I5020 Unspecified systolic (congestive) heart failure: Secondary | ICD-10-CM | POA: Insufficient documentation

## 2013-05-23 DIAGNOSIS — Z79899 Other long term (current) drug therapy: Secondary | ICD-10-CM | POA: Insufficient documentation

## 2013-05-23 DIAGNOSIS — J449 Chronic obstructive pulmonary disease, unspecified: Secondary | ICD-10-CM | POA: Insufficient documentation

## 2013-05-23 DIAGNOSIS — E119 Type 2 diabetes mellitus without complications: Secondary | ICD-10-CM | POA: Insufficient documentation

## 2013-05-23 DIAGNOSIS — Z87891 Personal history of nicotine dependence: Secondary | ICD-10-CM | POA: Insufficient documentation

## 2013-05-23 DIAGNOSIS — R079 Chest pain, unspecified: Secondary | ICD-10-CM

## 2013-05-23 DIAGNOSIS — Z9181 History of falling: Secondary | ICD-10-CM | POA: Insufficient documentation

## 2013-05-23 DIAGNOSIS — Z950 Presence of cardiac pacemaker: Secondary | ICD-10-CM | POA: Insufficient documentation

## 2013-05-23 DIAGNOSIS — I10 Essential (primary) hypertension: Secondary | ICD-10-CM | POA: Insufficient documentation

## 2013-05-23 DIAGNOSIS — G8929 Other chronic pain: Secondary | ICD-10-CM | POA: Insufficient documentation

## 2013-05-23 DIAGNOSIS — J4489 Other specified chronic obstructive pulmonary disease: Secondary | ICD-10-CM | POA: Insufficient documentation

## 2013-05-23 DIAGNOSIS — Z923 Personal history of irradiation: Secondary | ICD-10-CM | POA: Insufficient documentation

## 2013-05-23 DIAGNOSIS — M1990 Unspecified osteoarthritis, unspecified site: Secondary | ICD-10-CM | POA: Insufficient documentation

## 2013-05-23 DIAGNOSIS — Z85118 Personal history of other malignant neoplasm of bronchus and lung: Secondary | ICD-10-CM | POA: Insufficient documentation

## 2013-05-23 DIAGNOSIS — Z9889 Other specified postprocedural states: Secondary | ICD-10-CM | POA: Insufficient documentation

## 2013-05-23 DIAGNOSIS — R0789 Other chest pain: Secondary | ICD-10-CM | POA: Insufficient documentation

## 2013-05-23 DIAGNOSIS — Z794 Long term (current) use of insulin: Secondary | ICD-10-CM | POA: Insufficient documentation

## 2013-05-23 DIAGNOSIS — Z8673 Personal history of transient ischemic attack (TIA), and cerebral infarction without residual deficits: Secondary | ICD-10-CM | POA: Insufficient documentation

## 2013-05-23 DIAGNOSIS — H9190 Unspecified hearing loss, unspecified ear: Secondary | ICD-10-CM | POA: Insufficient documentation

## 2013-05-23 LAB — I-STAT TROPONIN, ED
TROPONIN I, POC: 0.01 ng/mL (ref 0.00–0.08)
Troponin i, poc: 0.01 ng/mL (ref 0.00–0.08)

## 2013-05-23 LAB — BASIC METABOLIC PANEL
BUN: 9 mg/dL (ref 6–23)
CALCIUM: 9.2 mg/dL (ref 8.4–10.5)
CO2: 30 meq/L (ref 19–32)
CREATININE: 0.91 mg/dL (ref 0.50–1.35)
Chloride: 105 mEq/L (ref 96–112)
GFR calc Af Amer: 89 mL/min — ABNORMAL LOW (ref >90)
GFR calc non Af Amer: 77 mL/min — ABNORMAL LOW (ref >90)
Glucose, Bld: 95 mg/dL (ref 70–99)
Potassium: 3.7 mEq/L (ref 3.7–5.3)
Sodium: 144 mEq/L (ref 137–147)

## 2013-05-23 LAB — CBC
HCT: 40.5 % (ref 39.0–52.0)
Hemoglobin: 13.7 g/dL (ref 13.0–17.0)
MCH: 31.7 pg (ref 26.0–34.0)
MCHC: 33.8 g/dL (ref 30.0–36.0)
MCV: 93.8 fL (ref 78.0–100.0)
PLATELETS: 123 10*3/uL — AB (ref 150–400)
RBC: 4.32 MIL/uL (ref 4.22–5.81)
RDW: 14.8 % (ref 11.5–15.5)
WBC: 4 10*3/uL (ref 4.0–10.5)

## 2013-05-23 NOTE — ED Notes (Signed)
Patient pacemaker interrogated- pending report. Dr. Jeneen Rinks made aware and patient c/o cough.

## 2013-05-23 NOTE — ED Notes (Signed)
Per EMS pt from [facility] c/o chest pain radiation down left arm lasting appx 15-20 min no associated symptoms. Pt has has 324 of ASA at facility. At EMS arrival pt denied CP- did not receive nitro. Pt denies CP at present time. Pt was Paced rhythm to NSR to demand pacing again. Pt has audible wheezing when moving to stretcher.    Hx. Of stroke with left side defecits.

## 2013-05-23 NOTE — ED Notes (Signed)
Rise Paganini- Network engineer notified to call [employer]

## 2013-05-23 NOTE — ED Provider Notes (Addendum)
CSN: [id number]     Arrival date & time 05/23/13  1129 History   First MD Initiated Contact with Patient 05/23/13 1156     Chief Complaint  Patient presents with  . Chest Pain     HPI  Vision presents after an episode of chest pain. It is approximately 1 hour ago. It lasted for about 20 minutes. He stays at [facility].  He has a history of cardiomyopathy, symptomatically bradycardia requiring pacemaker placement, Randall S.-not anticoagulated because he is a poor candidate, and lung cancer status post radiation therapy. He was sitting in the rec room at his facility in his wheelchair this morning. He developed an episode of left-sided chest pain. Did not radiate to his arm or neck. He was able to wheel himself to the nurses station at the facility near the rec room. Over 15-20 minutes it resolved. He was transferred here. He is currently asymptomatic. He denies any other recent episodes of pain. Is not short of breath. He is not tender in the chest. He does not have cough or hemoptysis.   Patient states that his wife brought the device to his care facility 3 days ago and apparently he was to have the report for a remote interrogation of his pacemaker. He is rather fixed on the point that "I never heard back". He states that he feels like if he had heard back from the cardiologist office that he may not have had the episode of chest pain.  Patient had his pacemaker interrogated prior to his recent radiation therapy as he was near the field his radiation therapy. His [employer] report at that time was normal.  Past Medical History  Diagnosis Date  . Diabetes mellitus   . Chronic shoulder pain   . Hypertension   . Hyperlipidemia   . Atrial fibrillation   . Atrial flutter   . Tachycardia-bradycardia   . Bradycardia   . GI bleeding   . Frequent falls   . BPH (benign prostatic hypertrophy)   . Asthma   . COPD (chronic obstructive pulmonary disease)   . Chronic constipation    . Spinal stenosis   . Peripheral neuropathy   . Hard of hearing   . Post-traumatic stress   . Osteoarthritis   . Arm fracture, left   . War injury due to shrapnel     Left shoulder  . Fall against object 06/02/2012  . Multiple closed fractures of ribs of right side 06/02/2012  . Chronic systolic CHF (congestive heart failure) 09/2012    EF 20-25%  . Hypertension   . Hyperlipidemia   . Diabetes mellitus, type 2   . A-fib   . BPH (benign prostatic hyperplasia)   . Stroke 1997    l sided weakness  . Hx of radiation therapy 6/24, 6/26, 08/28/2012    SBRT LUL lung nodule, 54 gray in 3 fx  . Lung cancer     left   Past Surgical History  Procedure Laterality Date  . Insert / replace / remove pacemaker  08-16-2010    left upper chest  . Knee surgery    . Ivc filter    . Cardioversion      2005  . Arm surgery     Family History  Problem Relation Age of Onset  . Diabetes Mother     died on her 28's  . Peripheral vascular disease Mother   . Heart attack Father     died on his 81's  .  Diabetes Brother    History  Substance Use Topics  . Smoking status: Former Smoker    Quit date: 02/27/1994  . Smokeless tobacco: Never Used  . Alcohol Use: No    Review of Systems  Constitutional: Negative for fever, chills, diaphoresis, appetite change and fatigue.       Patient is slow and methodical with his answers. However he is oriented lucid.  HENT: Negative for mouth sores, sore throat and trouble swallowing.   Eyes: Negative for visual disturbance.  Respiratory: Negative for cough, chest tightness, shortness of breath and wheezing.   Cardiovascular: Negative for chest pain.  Gastrointestinal: Negative for nausea, vomiting, abdominal pain, diarrhea and abdominal distention.  Endocrine: Negative for polydipsia, polyphagia and polyuria.  Genitourinary: Negative for dysuria, frequency and hematuria.  Musculoskeletal: Negative for gait problem.  Skin: Negative for color change, pallor  and rash.  Neurological: Negative for dizziness, syncope, light-headedness and headaches.  Hematological: Does not bruise/bleed easily.  Psychiatric/Behavioral: Negative for behavioral problems and confusion.      Allergies  Beta adrenergic blockers; Digoxin and related; and Zocor  Home Medications   Current Outpatient Rx  Name  Route  Sig  Dispense  Refill  . amiodarone (PACERONE) 200 MG tablet   Oral   Take 200 mg by mouth daily.         [name] amLODipine (NORVASC) 5 MG tablet   Oral   Take 5 mg by mouth daily.         [name] aspirin 81 MG tablet   Oral   Take 81 mg by mouth daily.         [name] atorvastatin (LIPITOR) 10 MG tablet   Oral   Take 10 mg by mouth daily.         . benzonatate (TESSALON) 100 MG capsule   Oral   Take 200 mg by mouth 3 (three) times daily.         . calcium-vitamin D (OSCAL WITH D) 500-200 MG-UNIT per tablet   Oral   Take 1 tablet by mouth daily.         . Cholecalciferol (VITAMIN D3) 1000 UNITS CAPS   Oral   Take 1,000 Units by mouth every morning.          [name] doxazosin (CARDURA) 2 MG tablet   Oral   Take 2 mg by mouth at bedtime.           [name] ezetimibe (ZETIA) 10 MG tablet   Oral   Take 10 mg by mouth every morning.          . finasteride (PROSCAR) 5 MG tablet   Oral   Take 5 mg by mouth daily.         . furosemide (LASIX) 40 MG tablet   Oral   Take 40 mg by mouth 2 (two) times daily.           [name] gabapentin (NEURONTIN) 100 MG capsule   Oral   Take 100 mg by mouth 2 (two) times daily.         . hydrALAZINE (APRESOLINE) 25 MG tablet   Oral   Take 25 mg by mouth 3 (three) times daily.           . insulin lispro (HUMALOG) 100 UNIT/ML injection   Subcutaneous   Inject 1-9 Units into the skin 2 (two) times daily with a meal. 120-150 = 1 unit; 151-200 = 2 units; 201-250 = 3 units; 250-300 = 5 units;  301-350 = 7 units; 351-400 = 9 units         . niacin (NIASPAN) 500 MG CR tablet   Oral   Take 500 mg by mouth  at bedtime.          . potassium chloride (K-DUR) 10 MEQ tablet   Oral   Take 20 mEq by mouth daily.         . propranolol (INDERAL) 10 MG tablet   Oral   Take 10 mg by mouth 2 (two) times daily.         . ramipril (ALTACE) 10 MG capsule   Oral   Take 10 mg by mouth 2 (two) times daily.           . traZODone (DESYREL) 50 MG tablet   Oral   Take 50 mg by mouth at bedtime.            BP 187/90  Pulse 59  Temp(Src) 99 F (37.2 C) (Oral)  Resp 22  Ht 5\' 11"  (1.803 m)  Wt 196 lb (88.905 kg)  BMI 27.35 kg/m2  SpO2 92% Physical Exam  Constitutional: He is oriented to person, place, and time. He appears well-developed and well-nourished. No distress.  HENT:  Head: Normocephalic.  Eyes: Conjunctivae are normal. Pupils are equal, round, and reactive to light. No scleral icterus.  Neck: Normal range of motion. Neck supple. No thyromegaly present.  Cardiovascular: Normal rate and regular rhythm.  Exam reveals no gallop and no friction rub.   No murmur heard. Regular pulse. Paced rhythm on the monitor.  Pulmonary/Chest: Effort normal and breath sounds normal. No respiratory distress. He has no wheezes. He has no rales.  No wheezing or crackles on exam.  Abdominal: Soft. Bowel sounds are normal. He exhibits no distension. There is no tenderness. There is no rebound.  Musculoskeletal: Normal range of motion.  Neurological: He is alert and oriented to person, place, and time.  Skin: Skin is warm and dry. No rash noted.  Trace of lower extremities edema.  Psychiatric: He has a normal mood and affect. His behavior is normal.    ED Course  Procedures (including critical care time) Labs Review Labs Reviewed  CBC - Abnormal; Notable for the following:    Platelets 123 (*)    All other components within normal limits  BASIC METABOLIC PANEL - Abnormal; Notable for the following:    GFR calc non Af Amer 77 (*)    GFR calc Af Amer 89 (*)    All other components within normal  limits  I-STAT TROPOININ, ED  I-STAT TROPOININ, ED   Imaging Review Dg Chest Port 1 View  05/23/2013   CLINICAL DATA:  Chest pain, history hypertension, diabetes, asthma, COPD, atrial fibrillation  EXAM: PORTABLE CHEST - 1 VIEW  COMPARISON:  Portable exam 1325 hr compared 07/09/2012  FINDINGS: Left subclavian transvenous pacemaker leads projecting over right atrium and right ventricle.  Upper normal heart size.  Tortuous aorta.  Mediastinal contours and pulmonary vascularity normal.  Subsegmental atelectasis versus scarring left mid lung slightly increased since previous exam.  No acute infiltrate, pleural effusion or pneumothorax.  Bones demineralized.  IMPRESSION: Increased atelectasis versus scarring in left mid lung.   Electronically Signed   By: Lavonia Dana M.D.   On: 05/23/2013 13:34     EKG Interpretation   Date/Time:  Friday May 23 2013 11:31:41 EDT Ventricular Rate:  73 PR Interval:  62 QRS Duration: 102 QT Interval:  420 QTC  Calculation: 463 R Axis:   44 Text Interpretation:  Atrial-paced rhythm Confirmed by Jeneen Rinks  MD, [place]  [phone]) on 05/23/2013 12:01:08 PM Also confirmed by Jeneen Rinks  MD, [place] ([id number])   on 05/23/2013 3:05:50 PM      MDM   Final diagnoses:  Chest pain    Atrial paced EKG. Asymptomatic now, aproximately one hour after 15-20 minutes of left-sided pain. Plan will be & and chest x-ray evaluation. I'll contact his cardiologist tried to ascertain the results of his recent interrogation.  His pacer was interrogated here. He has not had any episodes today. He is intermittent having episodes of atrial fibrillation that her resolving spontaneously. Asymptomatic now. He is on amiodarone.   Discussed case with Dr. Wynonia Lawman. Is not concerned regarding the recent episode of pain. He felt the patient would be a poor candidate for any sort of intervention. Now over 4 hours episode he is normal troponin remains asymptomatic. He'll be discharged. Followup with Dr. Wynonia Lawman,  return here if any recurrence or new symptoms.    Tanna Furry, MD 05/23/13 [place], MD 05/23/13 [phone]

## 2013-05-23 NOTE — ED Notes (Signed)
Portable at bedside 

## 2013-05-23 NOTE — ED Notes (Signed)
Patient dressed- pants wet due to urine. Wife given pants and is bringing clean pair here. Pt given coffee and crackers while waiting. Alert and oriented.

## 2013-05-23 NOTE — Discharge Instructions (Signed)
Chest Pain (Nonspecific) °It is often hard to give a specific diagnosis for the cause of chest pain. There is always a chance that your pain could be related to something serious, such as a heart attack or a blood clot in the lungs. You need to follow up with your caregiver for further evaluation. °CAUSES  °· Heartburn. °· Pneumonia or bronchitis. °· Anxiety or stress. °· Inflammation around your heart (pericarditis) or lung (pleuritis or pleurisy). °· A blood clot in the lung. °· A collapsed lung (pneumothorax). It can develop suddenly on its own (spontaneous pneumothorax) or from injury (trauma) to the chest. °· Shingles infection (herpes zoster virus). °The chest wall is composed of bones, muscles, and cartilage. Any of these can be the source of the pain. °· The bones can be bruised by injury. °· The muscles or cartilage can be strained by coughing or overwork. °· The cartilage can be affected by inflammation and become sore (costochondritis). °DIAGNOSIS  °Lab tests or other studies, such as X-rays, electrocardiography, stress testing, or cardiac imaging, may be needed to find the cause of your pain.  °TREATMENT  °· Treatment depends on what may be causing your chest pain. Treatment may include: °· Acid blockers for heartburn. °· Anti-inflammatory medicine. °· Pain medicine for inflammatory conditions. °· Antibiotics if an infection is present. °· You may be advised to change lifestyle habits. This includes stopping smoking and avoiding alcohol, caffeine, and chocolate. °· You may be advised to keep your head raised (elevated) when sleeping. This reduces the chance of acid going backward from your stomach into your esophagus. °· Most of the time, nonspecific chest pain will improve within 2 to 3 days with rest and mild pain medicine. °HOME CARE INSTRUCTIONS  °· If antibiotics were prescribed, take your antibiotics as directed. Finish them even if you start to feel better. °· For the next few days, avoid physical  activities that bring on chest pain. Continue physical activities as directed. °· Do not smoke. °· Avoid drinking alcohol. °· Only take over-the-counter or prescription medicine for pain, discomfort, or fever as directed by your caregiver. °· Follow your caregiver's suggestions for further testing if your chest pain does not go away. °· Keep any follow-up appointments you made. If you do not go to an appointment, you could develop lasting (chronic) problems with pain. If there is any problem keeping an appointment, you must call to reschedule. °SEEK MEDICAL CARE IF:  °· You think you are having problems from the medicine you are taking. Read your medicine instructions carefully. °· Your chest pain does not go away, even after treatment. °· You develop a rash with blisters on your chest. °SEEK IMMEDIATE MEDICAL CARE IF:  °· You have increased chest pain or pain that spreads to your arm, neck, jaw, back, or abdomen. °· You develop shortness of breath, an increasing cough, or you are coughing up blood. °· You have severe back or abdominal pain, feel nauseous, or vomit. °· You develop severe weakness, fainting, or chills. °· You have a fever. °THIS IS AN EMERGENCY. Do not wait to see if the pain will go away. Get medical help at once. Call your local emergency services (911 in [place] Do not drive yourself to the hospital. °MAKE SURE YOU:  °· Understand these instructions. °· Will watch your condition. °· Will get help right away if you are not doing well or get worse. °Document Released: 11/23/2004 Document Revised: 05/08/2011 Document Reviewed: 09/19/2007 °ExitCare® Patient Information ©2014 [employer]   [employer]. ° °

## 2013-05-23 NOTE — ED Notes (Signed)
Wife called to update- appreciative of call. Rn gave Pt. Scrub pants.

## 2013-06-04 ENCOUNTER — Other Ambulatory Visit: Payer: Self-pay | Admitting: *Deleted

## 2013-06-04 ENCOUNTER — Other Ambulatory Visit: Payer: Self-pay | Admitting: Internal Medicine

## 2013-06-04 DIAGNOSIS — R4182 Altered mental status, unspecified: Secondary | ICD-10-CM

## 2013-06-06 ENCOUNTER — Ambulatory Visit
Admission: RE | Admit: 2013-06-06 | Discharge: 2013-06-06 | Disposition: A | Discharge status: Home / Self Care | Payer: Medicare Other | Source: Ambulatory Visit | Attending: Internal Medicine | Admitting: Internal Medicine

## 2013-06-06 DIAGNOSIS — R4182 Altered mental status, unspecified: Secondary | ICD-10-CM

## 2013-06-06 MED ORDER — IOHEXOL 300 MG/ML  SOLN
75.0000 mL | Freq: Once | INTRAMUSCULAR | Status: AC | PRN
  Administered 2013-06-06: 75 mL via INTRAVENOUS

## 2013-07-10 ENCOUNTER — Encounter: Payer: Self-pay | Admitting: Radiation Oncology

## 2013-07-10 ENCOUNTER — Ambulatory Visit
Admission: RE | Admit: 2013-07-10 | Discharge: 2013-07-10 | Disposition: A | Discharge status: Home / Self Care | Payer: Medicare Other | Source: Ambulatory Visit | Attending: Radiation Oncology | Admitting: Radiation Oncology

## 2013-07-10 VITALS — BP 144/81 | HR 65 | Temp 98.6°F | Ht 71.0 in | Wt 198.0 lb

## 2013-07-10 DIAGNOSIS — C3490 Malignant neoplasm of unspecified part of unspecified bronchus or lung: Secondary | ICD-10-CM

## 2013-07-10 NOTE — Progress Notes (Signed)
[name] here for follow up after treatment to his left lung.  He denies pain.  He reports shortness of breath.  His oxygen saturation on room air was 88%.  He was placed on 2l of oxygen via nasal canula and it was 96%.  He reports a frequent cough but can't bring anything up.  He reports a good appetite.  He reports being a wheelchair most of the time.  He reports that he tires easily.  He lives at [facility].

## 2013-07-10 NOTE — Progress Notes (Signed)
Radiation Oncology         [phone] ________________________________  Name: [name] MRN: [id number]  Date: 07/10/2013  DOB: 28-Oct-1928  Follow-Up Visit Note  CC: Jerlyn Ly, MD  Grace Isaac, MD  Diagnosis:  Clinical stage I non-small cell lung cancer of the Left Lung   Interval Since Last Radiation:  10  months  Narrative:  The patient returns today for routine follow-up.  He seems to be clinically stable. He is accompanied by his wife on evaluation today. He denies any pain in the chest area significant cough or hemoptysis or breathing problems                            ALLERGIES:  is allergic to beta adrenergic blockers; digoxin and related; and zocor.  Meds: Current Outpatient Prescriptions  Medication Sig Dispense Refill  . amiodarone (PACERONE) 200 MG tablet Take 200 mg by mouth daily.      [name] amLODipine (NORVASC) 5 MG tablet Take 5 mg by mouth daily.      [name] aspirin 81 MG tablet Take 81 mg by mouth daily.      [name] atorvastatin (LIPITOR) 10 MG tablet Take 10 mg by mouth daily.      . benzonatate (TESSALON) 100 MG capsule Take 200 mg by mouth 3 (three) times daily.      . calcium-vitamin D (OSCAL WITH D) 500-200 MG-UNIT per tablet Take 1 tablet by mouth daily.      . Cholecalciferol (VITAMIN D3) 1000 UNITS CAPS Take 1,000 Units by mouth every morning.       [name] doxazosin (CARDURA) 2 MG tablet Take 2 mg by mouth at bedtime.        [name] ezetimibe (ZETIA) 10 MG tablet Take 10 mg by mouth every morning.       . finasteride (PROSCAR) 5 MG tablet Take 5 mg by mouth daily.      . furosemide (LASIX) 40 MG tablet Take 40 mg by mouth 2 (two) times daily.        [name] gabapentin (NEURONTIN) 100 MG capsule Take 100 mg by mouth 2 (two) times daily.      . hydrALAZINE (APRESOLINE) 25 MG tablet Take 25 mg by mouth 3 (three) times daily.        . insulin lispro (HUMALOG) 100 UNIT/ML injection Inject 1-9 Units into the skin 2 (two) times daily with a meal. 120-150 = 1 unit; 151-200  = 2 units; 201-250 = 3 units; 250-300 = 5 units; 301-350 = 7 units; 351-400 = 9 units      . niacin (NIASPAN) 500 MG CR tablet Take 500 mg by mouth at bedtime.       . potassium chloride (K-DUR) 10 MEQ tablet Take 20 mEq by mouth daily.      . propranolol (INDERAL) 10 MG tablet Take 10 mg by mouth 2 (two) times daily.      . ramipril (ALTACE) 10 MG capsule Take 10 mg by mouth 2 (two) times daily.        . traZODone (DESYREL) 50 MG tablet Take 50 mg by mouth at bedtime.         No current facility-administered medications for this encounter.    Physical Findings: The patient is in no acute distress. Patient is alert and oriented.  height is 5\' 11"  (1.803 m) and weight is 198 lb (89.812 kg). His oral temperature is 98.6 F (37  C). His blood pressure is 144/81 and his pulse is 65. His oxygen saturation is 96%. . No palpable supraclavicular or axillary adenopathy. The lungs are clear except for some mild wheezing bilaterally. The heart has regular rhythm and rate. Oxygen saturation was 88% on room air. 2 L showed improvement and 96%.  Lab Findings: Lab Results  Component Value Date   WBC 4.0 05/23/2013   HGB 13.7 05/23/2013   HCT 40.5 05/23/2013   MCV 93.8 05/23/2013   PLT 123* 05/23/2013     Radiographic Findings: No results found.  Impression: Stage I non-small cell lung cancer status post SB RT  Plan:  The patient will be scheduled for chest CT scan for followup of his lung cancer. Routine followup in radiation oncology in 6 months. The nursing home will evaluate whether he may need supplemental oxygen on a regular basis.  ____________________________________ Blair Promise, MD

## 2013-07-11 ENCOUNTER — Telehealth: Payer: Self-pay | Admitting: Oncology

## 2013-07-11 NOTE — Telephone Encounter (Signed)
Talked to [place], Suanne Marker, at [facility].  Advised her that [name]'s oxygen saturation was low yesterday at 88%.  Told her that Dr. Sondra Come had written on the instructions for him to be evaluated for supplemental oxygen.  Suanne Marker said she did not get the paperwork and would have him evaluated for oxygen.

## 2013-07-18 ENCOUNTER — Ambulatory Visit
Admission: RE | Admit: 2013-07-18 | Discharge: 2013-07-18 | Disposition: A | Discharge status: Home / Self Care | Payer: Medicare Other | Source: Ambulatory Visit | Attending: Radiation Oncology | Admitting: Radiation Oncology

## 2013-07-18 ENCOUNTER — Ambulatory Visit (HOSPITAL_COMMUNITY)
Admission: RE | Admit: 2013-07-18 | Discharge: 2013-07-18 | Disposition: A | Discharge status: Home / Self Care | Payer: Medicare Other | Source: Ambulatory Visit | Attending: Radiation Oncology | Admitting: Radiation Oncology

## 2013-07-18 ENCOUNTER — Encounter (HOSPITAL_COMMUNITY): Payer: Self-pay

## 2013-07-18 DIAGNOSIS — C3490 Malignant neoplasm of unspecified part of unspecified bronchus or lung: Secondary | ICD-10-CM

## 2013-07-18 DIAGNOSIS — Z923 Personal history of irradiation: Secondary | ICD-10-CM | POA: Insufficient documentation

## 2013-07-18 LAB — BUN AND CREATININE (CC13)
BUN: 10.1 mg/dL (ref 7.0–26.0)
Creatinine: 1 mg/dL (ref 0.7–1.3)

## 2013-07-18 MED ORDER — IOHEXOL 300 MG/ML  SOLN
80.0000 mL | Freq: Once | INTRAMUSCULAR | Status: AC | PRN
  Administered 2013-07-18: 80 mL via INTRAVENOUS

## 2013-12-30 ENCOUNTER — Ambulatory Visit (INDEPENDENT_AMBULATORY_CARE_PROVIDER_SITE_OTHER): Payer: Medicare Other

## 2013-12-30 VITALS — BP 138/78 | HR 74 | Temp 98.6°F | Resp 15

## 2013-12-30 DIAGNOSIS — E11621 Type 2 diabetes mellitus with foot ulcer: Secondary | ICD-10-CM

## 2013-12-30 DIAGNOSIS — R0989 Other specified symptoms and signs involving the circulatory and respiratory systems: Secondary | ICD-10-CM

## 2013-12-30 DIAGNOSIS — L03032 Cellulitis of left toe: Secondary | ICD-10-CM

## 2013-12-30 DIAGNOSIS — E1140 Type 2 diabetes mellitus with diabetic neuropathy, unspecified: Secondary | ICD-10-CM

## 2013-12-30 DIAGNOSIS — L97509 Non-pressure chronic ulcer of other part of unspecified foot with unspecified severity: Secondary | ICD-10-CM

## 2013-12-30 DIAGNOSIS — R609 Edema, unspecified: Secondary | ICD-10-CM

## 2013-12-30 DIAGNOSIS — L02612 Cutaneous abscess of left foot: Secondary | ICD-10-CM

## 2013-12-30 DIAGNOSIS — I739 Peripheral vascular disease, unspecified: Secondary | ICD-10-CM

## 2013-12-30 MED ORDER — SILVER SULFADIAZINE 1 % EX CREA: 1.0000 "application " | TOPICAL_CREAM | Freq: Every day | CUTANEOUS | Status: AC

## 2013-12-30 NOTE — Progress Notes (Signed)
   Subjective:    Patient ID: [name], male    DOB: 1928/12/31, 80 y.o.   MRN: [id number]  HPI Pt presents with left great toe pain, noted drainage, erythema and swelling since 12/17/13, he is afebrile, noted edema in left foot and weak pedal pulse, foot cold to the touch. Previous wound care treatment at nursing home was clean wound with normal saline and apply antibiotic ointment BID and leave open to air. Patient was seen by nurse practitioner who wrote a prescription for doxycycline today   Review of Systems  Respiratory: Positive for wheezing.   Cardiovascular: Positive for leg swelling.  Genitourinary: Positive for urgency and frequency.  Neurological: Positive for speech difficulty.  All other systems reviewed and are negative.      Objective:   Physical Exam 80 year old male presents at this time with caregiver from nursing facility with a painful left great toe been draining along the lateral proximal nail fold swelling some going on for approximately a week and a half to 2 weeks now. +2 edema left foot with thready or week pulses being noted there is been some wound care treatment with Neosporin application at the skilled facility twice daily patient was placed on a prescription for doxycycline earlier today likely has not had a dose at this point. Lower extremity objective findings as follows vascular status reveals no pedal pulses thready nonpalpable bilateral DP and PT skin temperature warm to cool there is + edema in the right +2 edema on the left foot ankle and leg. There is some malodor with some granulation of the proximal lateral nail fold of the left hallux and dried blood within the interspace noted although there is no increased temperature of the toe is cool to the touch patient apparently is afebrile temperature 98.6. Patient has significant hypersensitivity of the foot has a long history of problems including lung cancer hemiparesis renal mass chronic obstructive  pulmonary disease tachycardia obesity diabetes type 2 hypertension congestive heart failure and multiple other issues. List of medications which are also reviewed at this time no a new medication is the doxycycline which was prescribed earlier today. No x-rays taken at this time exam of the hallux reveals fluctuance of the nail plate with some loosening or separation of nail plate which is tender at this time the right hallux left hallux nail plate is easily debrided or avulsed from the nailbedthere is no purulence does not go down to bone or capsule however the nailbed and lateral nail fold are exposed with granulomatous and fibrous tissue. The area is cleansed with all cleansed Iodosorb and a gauze dressing are applied a prescription for Silvadene will be furnished       Assessment & Plan:  Assessment ischemic ulceration and likely vascular compromise the foot and legs leading to ulceration of the proximal lateral nail fold of the left hallux. The nail plate is avulsed partially without need for anesthesia as it was RE lysing or separating for the nailbed on the nailbed is cleansed and dressed with Iodosorb and gauze dressing orders for daily Silvadene gauze dressing change are given a prescription for Silvadene is forwarded to the pharmacy. She will continue with doxycycline antibiotic daily as instructed reappoint one week for follow-up and reevaluation instructions orders for daily cleansing and dressing changes are given at this time. Next  Harriet Masson DPM

## 2013-12-30 NOTE — Patient Instructions (Signed)
ANTIBACTERIAL SOAP INSTRUCTIONS  THE DAY AFTER PROCEDURE  Please follow the instructions your doctor has marked.   Shower as usual. Before getting out, place a drop of antibacterial liquid soap (Dial) on a wet, clean washcloth.  Gently wipe washcloth over affected area.  Afterward, rinse the area with warm water.  Blot the area dry with a soft cloth and cover with antibiotic ointment (neosporin, polysporin, bacitracin) and band aid or gauze and tape  Place 3-4 drops of antibacterial liquid soap in a quart of warm tap water.  Submerge foot into water for 20 minutes.  If bandage was applied after your procedure, leave on to allow for easy lift off, then remove and continue with soak for the remaining time.  Next, blot area dry with a soft cloth and cover with a bandage.  Apply other medications as directed by your doctor, such as cortisporin otic solution (eardrops) or neosporin antibiotic ointment  Wash her cleanse the foot daily with antibacterial soap and warm water and dried thoroughly apply Silvadene and gauze dressing as instructed daily

## 2013-12-31 ENCOUNTER — Other Ambulatory Visit (HOSPITAL_COMMUNITY): Payer: Self-pay

## 2013-12-31 ENCOUNTER — Telehealth (HOSPITAL_COMMUNITY): Payer: Self-pay | Admitting: *Deleted

## 2013-12-31 DIAGNOSIS — R0989 Other specified symptoms and signs involving the circulatory and respiratory systems: Secondary | ICD-10-CM

## 2014-01-06 ENCOUNTER — Ambulatory Visit (HOSPITAL_COMMUNITY)
Admission: RE | Admit: 2014-01-06 | Discharge: 2014-01-06 | Disposition: A | Discharge status: Home / Self Care | Payer: Medicare Other | Source: Ambulatory Visit | Attending: Cardiovascular Disease | Admitting: Cardiovascular Disease

## 2014-01-06 ENCOUNTER — Ambulatory Visit (INDEPENDENT_AMBULATORY_CARE_PROVIDER_SITE_OTHER): Payer: Medicare Other

## 2014-01-06 VITALS — BP 152/97 | HR 68 | Temp 98.3°F | Resp 17

## 2014-01-06 DIAGNOSIS — L97509 Non-pressure chronic ulcer of other part of unspecified foot with unspecified severity: Secondary | ICD-10-CM | POA: Insufficient documentation

## 2014-01-06 DIAGNOSIS — R609 Edema, unspecified: Secondary | ICD-10-CM

## 2014-01-06 DIAGNOSIS — I739 Peripheral vascular disease, unspecified: Secondary | ICD-10-CM

## 2014-01-06 DIAGNOSIS — L02612 Cutaneous abscess of left foot: Secondary | ICD-10-CM

## 2014-01-06 DIAGNOSIS — R0989 Other specified symptoms and signs involving the circulatory and respiratory systems: Secondary | ICD-10-CM | POA: Insufficient documentation

## 2014-01-06 DIAGNOSIS — L03032 Cellulitis of left toe: Secondary | ICD-10-CM

## 2014-01-06 DIAGNOSIS — E11621 Type 2 diabetes mellitus with foot ulcer: Secondary | ICD-10-CM

## 2014-01-06 DIAGNOSIS — E1140 Type 2 diabetes mellitus with diabetic neuropathy, unspecified: Secondary | ICD-10-CM

## 2014-01-06 NOTE — Patient Instructions (Signed)
ANTIBACTERIAL SOAP INSTRUCTIONS  THE DAY AFTER PROCEDURE  Please follow the instructions your doctor has marked.   Shower as usual. Before getting out, place a drop of antibacterial liquid soap (Dial) on a wet, clean washcloth.  Gently wipe washcloth over affected area.  Afterward, rinse the area with warm water.  Blot the area dry with a soft cloth and cover with antibiotic ointment (neosporin, polysporin, bacitracin) and band aid or gauze and tape  Place 3-4 drops of antibacterial liquid soap in a quart of warm tap water.  Submerge foot into water for 20 minutes.  If bandage was applied after your procedure, leave on to allow for easy lift off, then remove and continue with soak for the remaining time.  Next, blot area dry with a soft cloth and cover with a bandage.  Apply other medications as directed by your doctor, such as cortisporin otic solution (eardrops) or neosporin antibiotic ointment  Wash her cleansed toe daily antibacterial soap and water dry thoroughly apply Silvadene and Band-Aid dressing daily to the wound is resolved completely.

## 2014-01-06 NOTE — Progress Notes (Signed)
Arterial Duplex Lower Ext. Completed. Afsana Liera, BS, RDMS, RVT  

## 2014-01-06 NOTE — Progress Notes (Signed)
   Subjective:    Patient ID: [name], male    DOB: 11/27/1928, 80 y.o.   MRN: [id number]  HPI  Pt presents for left great nail recheck from last week, infected toe nail. Absent pedal pulse and 2+ pitting edema in left foot, was sent for vascular study  Review of Systemsno new findings or systemic changes noted     Objective:   Physical Exam Patient presents for follow-up of paronychia and ulceration lateral nail border lateral nail fold of left great toe there is some mild erythema noted some mild granulation tissue although greatly improved no malodor no increased temperature no signs of fever chills no signs of infection at current time. Her sensitivity in the forefoot first MTP area as well as the left hallux +2 pitting edema still present nonpalpable pedal pulses. Patient had vascular studies done this morning we have not gotten results as of yet.       Assessment & Plan:  Assessment this time improving ulceration and cellulitis left great toe cellulose pierced resolved no discharge drainage no malodor no fever chills however we'll continue with Silvadene gauze dressing changes daily cleansing with soap and water as instructed continue to offload the area will recheck in 2 weeks for further follow-up likely will continue monitor for the vascular studies to assess his healing capabilities however so far seems to be stable there is again some reverse changes in adjacent digits cool temperature signs of vascular compromise and ischemic changes of the left foot noted however significant edema also contributing to difficulties maintain accommodative shoes minimize ambulation or activities in a wheelchair for the most part recheck in 2 weeks for follow-up  The ulcer lateral nail bed nail fold is debrided at this time and Silvadene and gauze and Band-Aid dressing are applied. f Harriet Masson DPM

## 2014-01-08 ENCOUNTER — Telehealth: Payer: Self-pay | Admitting: Cardiovascular Disease

## 2014-01-08 NOTE — Telephone Encounter (Signed)
Dr. Eugenia Pancoast called in stating that he is the pt's POA and will be unable to make his appt tomorrow and would like some information on the pt's blood flow levels. Please call  Thanks

## 2014-01-08 NOTE — Telephone Encounter (Signed)
I spoke with Dr Lanell Matar and explained the doppler results.  He would like Dr Gwenlyn Found to call him tomorrow after his uncle's evaluation. [phone]

## 2014-01-09 ENCOUNTER — Ambulatory Visit (INDEPENDENT_AMBULATORY_CARE_PROVIDER_SITE_OTHER): Payer: Medicare Other | Admitting: Cardiovascular Disease

## 2014-01-09 ENCOUNTER — Encounter: Payer: Self-pay | Admitting: Cardiovascular Disease

## 2014-01-09 VITALS — BP 132/82 | HR 64

## 2014-01-09 DIAGNOSIS — I70229 Atherosclerosis of native arteries of extremities with rest pain, unspecified extremity: Secondary | ICD-10-CM

## 2014-01-09 DIAGNOSIS — I998 Other disorder of circulatory system: Secondary | ICD-10-CM | POA: Insufficient documentation

## 2014-01-09 NOTE — Assessment & Plan Note (Signed)
The patient was referred to me by Dr. Blenda Mounts for evaluation of critical limb ischemia. He is a 80 year old mildly overweight married African-American male who lives in [place] home. He has a long past medical history including tachybradycardia syndrome and pacemaker insertion in the past. His cardiologist is Dr. Tollie Eth. He is principally wheelchair-bound. He has a ischemic ulcer on his left great toe. Lower extremity arterial Doppler studies performed in our office 01/06/14 revealed a left ABI of 0.31 with occluded left SFA and popliteal artery as well as posterior tibial artery. At this point I prefer to treat him with aggressive local care. I had a long conversation with his nephew who is a Pharmacist, community and health care power of attorney. I suggested that he return to the excellent care of Dr. Blenda Mounts. Consideration should be given to referral to the wound care center for potential hyperbaric treatment. If his toe does not show signs of healing we can then entertain the possibility of a more aggressive/interventional approach.

## 2014-01-09 NOTE — Progress Notes (Signed)
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 His primary care physician is Dr. Crist Infante and his cardiologist Dr. Tollie Eth. He was referred by Dr. Blenda Mounts, his podiatrist, for peripheral vascular evaluation because of critical limb ischemia. He has a history of tachybradycardia syndrome status post permanent transvenous pacemaker insertion. He's had a stroke and a heart attack apparently in the past. He does have a pacemaker. The problems include hypertension, systolic heart failure, COPD and history of lung cancer. He lives at [place]. He is presently wheelchair bound. He had arterial Dopplers performed in our office 01/06/14 revealing a left ABI 0.77 with a left SFA and popliteal artery as well as occluded posterior tibial artery. He has a nonhealing wound on his left great toe.   Current Outpatient Prescriptions  Medication Sig Dispense Refill  . amiodarone (PACERONE) 200 MG tablet Take 200 mg by mouth daily.    [name] amLODipine (NORVASC) 5 MG tablet Take 5 mg by mouth daily.    [name] aspirin 81 MG tablet Take 81 mg by mouth daily.    [name] atorvastatin (LIPITOR) 10 MG tablet Take 10 mg by mouth daily.    . benzonatate (TESSALON) 100 MG capsule Take 200 mg by mouth 3 (three) times daily.    . calcium-vitamin D (OSCAL WITH D) 500-200 MG-UNIT per tablet Take 1 tablet by mouth daily.    . Cholecalciferol (VITAMIN D3) 1000 UNITS CAPS Take 1,000 Units by mouth every morning.     [name] doxazosin (CARDURA) 2 MG tablet Take 2 mg by mouth at bedtime.      [name] ezetimibe (ZETIA) 10 MG tablet Take 10 mg by mouth every morning.     . finasteride (PROSCAR) 5 MG  tablet Take 5 mg by mouth daily.    . furosemide (LASIX) 40 MG tablet Take 40 mg by mouth 2 (two) times daily.      [name] gabapentin (NEURONTIN) 100 MG capsule Take 100 mg by mouth 2 (two) times daily.    . hydrALAZINE (APRESOLINE) 25 MG tablet Take 25 mg by mouth 3 (three) times daily.      . insulin lispro (HUMALOG) 100 UNIT/ML injection Inject 1-9 Units into the skin 2 (two) times daily with a meal. 120-150 = 1 unit; 151-200 = 2 units; 201-250 = 3 units; 250-300 = 5 units; 301-350 = 7 units; 351-400 = 9 units    . niacin (NIASPAN) 500 MG CR tablet Take 500 mg by mouth at bedtime.     . potassium chloride (K-DUR) 10 MEQ tablet Take 20 mEq by mouth daily.    . propranolol (INDERAL) 10 MG tablet Take 10 mg by mouth 2 (two) times daily.    . ramipril (ALTACE) 10 MG capsule Take 10 mg by mouth 2 (two) times daily.      . silver sulfADIAZINE (SILVADENE) 1 % cream Apply 1 application topically daily. 50 g 2  . traZODone (DESYREL) 50 MG tablet Take 50 mg by mouth at bedtime.       No current facility-administered medications for this visit.    Allergies  Allergen Reactions  . Beta Adrenergic Blockers  unknown  . Digoxin And Related     unknown  . Zocor [Simvastatin]     unknown    History   Social History  . Marital Status: Married    Spouse Name: N/A    Number of Children: 1  . Years of Education: N/A   Occupational History  . RETIRED    Social History Main Topics  . Smoking status: Former Smoker    Quit date: 02/27/1994  . Smokeless tobacco: Never Used  . Alcohol Use: No  . Drug Use: No  . Sexual Activity: Not on file   Other Topics Concern  . Not on file   Social History Narrative   21 years in the [employer]   20 years with [employer]   50 ppy tobacco HX- quit in 1996   No H/O of alcohol use     Review of Systems: General: negative for chills, fever, night sweats or weight changes.  Cardiovascular: negative for chest pain, dyspnea on exertion, edema,  orthopnea, palpitations, paroxysmal nocturnal dyspnea or shortness of breath Dermatological: negative for rash Respiratory: negative for cough or wheezing Urologic: negative for hematuria Abdominal: negative for nausea, vomiting, diarrhea, bright red blood per rectum, melena, or hematemesis Neurologic: negative for visual changes, syncope, or dizziness All other systems reviewed and are otherwise negative except as noted above.    Blood pressure 132/82, pulse 64.  General appearance: alert and no distress Neck: no adenopathy, no carotid bruit, no JVD, supple, symmetrical, trachea midline and thyroid not enlarged, symmetric, no tenderness/mass/nodules Lungs: clear to auscultation bilaterally Heart: regular rate and rhythm, S1, S2 normal, no murmur, click, rub or gallop Extremities: 2+ pitting edema bilaterally, nonhealing wound on the dorsal aspect of the left great toe involving the nailbed.  EKG not performed today  ASSESSMENT AND PLAN:   Critical lower limb ischemia The patient was referred to me by Dr. Blenda Mounts for evaluation of critical limb ischemia. He is a 80 year old mildly overweight married African-American male who lives in [place] home. He has a long past medical history including tachybradycardia syndrome and pacemaker insertion in the past. His cardiologist is Dr. Tollie Eth. He is principally wheelchair-bound. He has a ischemic ulcer on his left great toe. Lower extremity arterial Doppler studies performed in our office 01/06/14 revealed a left ABI of 0.31 with occluded left SFA and popliteal artery as well as posterior tibial artery. At this point I prefer to treat him with aggressive local care. I had a long conversation with his nephew who is a Pharmacist, community and health care power of attorney. I suggested that he return to the excellent care of Dr. Blenda Mounts. Consideration should be given to referral to the wound care center for potential hyperbaric treatment. If his toe  does not show signs of healing we can then entertain the possibility of a more aggressive/interventional approach.      Lorretta Harp MD FACP,FACC,FAHA, [facility] 01/09/2014 5:21 PM

## 2014-01-09 NOTE — Patient Instructions (Addendum)
Your physician wants you to follow-up IN 1 MONTH to discuss further plan of care.

## 2014-01-12 ENCOUNTER — Ambulatory Visit: Payer: Medicare Other | Admitting: Radiation Oncology

## 2014-01-15 ENCOUNTER — Ambulatory Visit
Admission: RE | Admit: 2014-01-15 | Discharge: 2014-01-15 | Disposition: A | Discharge status: Home / Self Care | Payer: Medicare Other | Source: Ambulatory Visit | Attending: Radiation Oncology | Admitting: Radiation Oncology

## 2014-01-15 ENCOUNTER — Encounter: Payer: Self-pay | Admitting: Radiation Oncology

## 2014-01-15 DIAGNOSIS — Z7982 Long term (current) use of aspirin: Secondary | ICD-10-CM | POA: Diagnosis not present

## 2014-01-15 DIAGNOSIS — Z950 Presence of cardiac pacemaker: Secondary | ICD-10-CM | POA: Diagnosis not present

## 2014-01-15 DIAGNOSIS — Z794 Long term (current) use of insulin: Secondary | ICD-10-CM | POA: Diagnosis not present

## 2014-01-15 DIAGNOSIS — C3412 Malignant neoplasm of upper lobe, left bronchus or lung: Secondary | ICD-10-CM | POA: Insufficient documentation

## 2014-01-15 DIAGNOSIS — Z510 Encounter for antineoplastic radiation therapy: Secondary | ICD-10-CM | POA: Diagnosis present

## 2014-01-15 LAB — BUN AND CREATININE (CC13)
BUN: 11.4 mg/dL (ref 7.0–26.0)
Creatinine: 1 mg/dL (ref 0.7–1.3)

## 2014-01-15 NOTE — Progress Notes (Signed)
[name] here for follow up after treatment to his left lung.  He reports pain in his left foot from an ulcer.  He reports a dry cough.  He denies a sore throat and trouble swallowing.  He reports shortness of breath.  His oxygen sat today was 97% on room air.  He denies fatigue.  He is in a wheelchair and is living at [facility].

## 2014-01-15 NOTE — Progress Notes (Signed)
Radiation Oncology         [phone] ________________________________  Name: DOMONICK SITTNER MRN: [id number]  Date: 01/15/2014  DOB: 1928/05/21  Follow-Up Visit Note  CC: Jerlyn Ly, MD  Grace Isaac, MD    ICD-9-CM ICD-10-CM   1. Malignant neoplasm of upper lobe of left lung 162.3 C34.12 BUN     Creatinine, serum    Diagnosis:   Clinical stage I non-small cell lung cancer of the Left Lung  Interval Since Last Radiation:  17 months,  The patient completed stereotactic body radiation therapy directed at his left upper lobe nodule, 54 gray in 3 fractions  Narrative:  The patient returns today for routine follow-up.  He seems to be doing recently well except for problems with the ulcer along his left leg. The patient denies any pain in the chest area significant cough or hemoptysis.                             ALLERGIES:  is allergic to beta adrenergic blockers; digoxin and related; and zocor.  Meds: Current Outpatient Prescriptions  Medication Sig Dispense Refill  . acetaminophen (TYLENOL) 325 MG tablet Take 650 mg by mouth every 6 (six) hours as needed.    [name] amiodarone (PACERONE) 200 MG tablet Take 200 mg by mouth daily.    [name] amLODipine (NORVASC) 5 MG tablet Take 5 mg by mouth daily.    [name] aspirin 81 MG tablet Take 81 mg by mouth daily.    [name] atorvastatin (LIPITOR) 10 MG tablet Take 10 mg by mouth daily.    . benzonatate (TESSALON) 100 MG capsule Take 200 mg by mouth 3 (three) times daily.    . calcium-vitamin D (OSCAL WITH D) 500-200 MG-UNIT per tablet Take 1 tablet by mouth daily.    . Cholecalciferol (VITAMIN D3) 1000 UNITS CAPS Take 1,000 Units by mouth every morning.     [name] doxazosin (CARDURA) 2 MG tablet Take 2 mg by mouth at bedtime.      [name] doxycycline (DORYX) 100 MG EC tablet Take 100 mg by mouth daily.    [name] ezetimibe (ZETIA) 10 MG tablet Take 10 mg by mouth every morning.     . finasteride (PROSCAR) 5 MG tablet Take 5 mg by mouth daily.    . furosemide  (LASIX) 40 MG tablet Take 40 mg by mouth 2 (two) times daily.      [name] gabapentin (NEURONTIN) 100 MG capsule Take 100 mg by mouth 2 (two) times daily.    . hydrALAZINE (APRESOLINE) 25 MG tablet Take 25 mg by mouth 3 (three) times daily.      . insulin lispro (HUMALOG) 100 UNIT/ML injection Inject 1-9 Units into the skin 2 (two) times daily with a meal. 120-150 = 1 unit; 151-200 = 2 units; 201-250 = 3 units; 250-300 = 5 units; 301-350 = 7 units; 351-400 = 9 units    . niacin (NIASPAN) 500 MG CR tablet Take 500 mg by mouth at bedtime.     . potassium chloride (K-DUR) 10 MEQ tablet Take 20 mEq by mouth daily.    . propranolol (INDERAL) 10 MG tablet Take 10 mg by mouth 2 (two) times daily.    . ramipril (ALTACE) 10 MG capsule Take 10 mg by mouth 2 (two) times daily.      . traMADol (ULTRAM) 50 MG tablet Take 50 mg by mouth every 6 (six) hours as needed.    [name]  traZODone (DESYREL) 50 MG tablet Take 50 mg by mouth at bedtime.      . silver sulfADIAZINE (SILVADENE) 1 % cream Apply 1 application topically daily. 50 g 2   No current facility-administered medications for this encounter.    Physical Findings: The patient is in no acute distress. Patient is alert and oriented.  height is 5\' 11"  (1.803 m) and weight is 203 lb (92.08 kg). His oral temperature is 97.4 F (36.3 C). His blood pressure is 146/75 and his pulse is 63. His respiration is 16 and oxygen saturation is 97%. .  No palpable supraclavicular or axillary adenopathy. The lungs are clear to auscultation. The heart has regular rhythm and rate. Patient has a pacemaker in place in the left upper chest.  Lab Findings: Lab Results  Component Value Date   WBC 4.0 05/23/2013   HGB 13.7 05/23/2013   HCT 40.5 05/23/2013   MCV 93.8 05/23/2013   PLT 123* 05/23/2013    Radiographic Findings: No results found.  Impression:  No evidence of recurrence on clinical exam today  Plan:  The patient will be set up for a repeat CT scan of the chest to  assess his response to stereotactic body radiation therapy. Routine follow-up in radiation oncology in 6 months.  ____________________________________ Blair Promise, MD

## 2014-01-20 ENCOUNTER — Ambulatory Visit (INDEPENDENT_AMBULATORY_CARE_PROVIDER_SITE_OTHER): Payer: Medicare Other

## 2014-01-20 VITALS — BP 132/78 | HR 66 | Resp 18

## 2014-01-20 DIAGNOSIS — E1140 Type 2 diabetes mellitus with diabetic neuropathy, unspecified: Secondary | ICD-10-CM

## 2014-01-20 DIAGNOSIS — L97501 Non-pressure chronic ulcer of other part of unspecified foot limited to breakdown of skin: Secondary | ICD-10-CM

## 2014-01-20 DIAGNOSIS — I739 Peripheral vascular disease, unspecified: Secondary | ICD-10-CM

## 2014-01-20 DIAGNOSIS — L03032 Cellulitis of left toe: Secondary | ICD-10-CM

## 2014-01-20 DIAGNOSIS — L89891 Pressure ulcer of other site, stage 1: Secondary | ICD-10-CM

## 2014-01-20 DIAGNOSIS — L02612 Cutaneous abscess of left foot: Secondary | ICD-10-CM

## 2014-01-20 DIAGNOSIS — L97509 Non-pressure chronic ulcer of other part of unspecified foot with unspecified severity: Secondary | ICD-10-CM

## 2014-01-20 DIAGNOSIS — E11621 Type 2 diabetes mellitus with foot ulcer: Secondary | ICD-10-CM

## 2014-01-20 NOTE — Progress Notes (Signed)
   Subjective:    Patient ID: [name], male    DOB: 26-Dec-1928, 80 y.o.   MRN: [id number]  HPI MY TOENAIL ON MY LEFT BIG TOE IS BETTER BUT STILL HURTS SOME AND THERE IS NO DRAINING    Review of Systems no new findings or systemic changes     Objective:   Physical Exam No other new changes patient nonambulatory in a wheelchair continues to have ulceration with some necrotic tissue lateral nail fold left great toe. +2 edema noted no varicosities noted pedal pulses DP and PT thready at one over 4 best bilateral capillary refill timed 3-4 seconds temperature warm to cool turgor grossly diminished. There is no malodor no purulence no secondary infections noted at the current time there is some fibrous portion of the ulceration lateral nail fold surrounding macerated keratoses.       Assessment & Plan:  Assessment slowly improving ulceration cellulitis appears to resolve however the ulcer still present with some necrotic and fibrous tissue in the lateral nail fold the fibrous plug of about 4 x 10 mm with surrounding hemorrhage a keratoses run the periphery no purulence no malodor no signs of secondary infection significant vascular compromise with cold temperature decreased turgor rigid contractures of toes noted this time the ulcer is debrided of any loose or necrotic tissue Silvadene and gauze dressing reapplied will continue daily cleansing soap and water and Silvadene and dressing changes daily recheck in 3-4 weeks for further follow-up  Harriet Masson DPM

## 2014-01-20 NOTE — Patient Instructions (Signed)
ANTIBACTERIAL SOAP INSTRUCTIONS  THE DAY AFTER PROCEDURE  Please follow the instructions your doctor has marked.   Shower as usual. Before getting out, place a drop of antibacterial liquid soap (Dial) on a wet, clean washcloth.  Gently wipe washcloth over affected area.  Afterward, rinse the area with warm water.  Blot the area dry with a soft cloth and cover with antibiotic ointment (neosporin, polysporin, bacitracin) and band aid or gauze and tape  Place 3-4 drops of antibacterial liquid soap in a quart of warm tap water.  Submerge foot into water for 20 minutes.  If bandage was applied after your procedure, leave on to allow for easy lift off, then remove and continue with soak for the remaining time.  Next, blot area dry with a soft cloth and cover with a bandage.  Apply other medications as directed by your doctor, such as cortisporin otic solution (eardrops) or neosporin antibiotic ointment  Continue daily cleansing of wound ulcer site left great toe with soap and water dry thoroughly apply Silvadene and gauze wrap daily as instructed continue daily dressing changes until ulcer has resolved

## 2014-01-29 ENCOUNTER — Encounter (HOSPITAL_COMMUNITY): Payer: Self-pay

## 2014-01-29 ENCOUNTER — Ambulatory Visit (HOSPITAL_COMMUNITY)
Admission: RE | Admit: 2014-01-29 | Discharge: 2014-01-29 | Disposition: A | Discharge status: Home / Self Care | Payer: Medicare Other | Source: Ambulatory Visit | Attending: Radiation Oncology | Admitting: Radiation Oncology

## 2014-01-29 DIAGNOSIS — J449 Chronic obstructive pulmonary disease, unspecified: Secondary | ICD-10-CM | POA: Insufficient documentation

## 2014-01-29 DIAGNOSIS — Z923 Personal history of irradiation: Secondary | ICD-10-CM | POA: Insufficient documentation

## 2014-01-29 DIAGNOSIS — C3412 Malignant neoplasm of upper lobe, left bronchus or lung: Secondary | ICD-10-CM | POA: Diagnosis not present

## 2014-01-29 DIAGNOSIS — I2510 Atherosclerotic heart disease of native coronary artery without angina pectoris: Secondary | ICD-10-CM | POA: Insufficient documentation

## 2014-01-29 DIAGNOSIS — R911 Solitary pulmonary nodule: Secondary | ICD-10-CM | POA: Diagnosis not present

## 2014-01-29 MED ORDER — IOHEXOL 300 MG/ML  SOLN
80.0000 mL | Freq: Once | INTRAMUSCULAR | Status: AC | PRN
  Administered 2014-01-29: 80 mL via INTRAVENOUS

## 2014-02-06 ENCOUNTER — Encounter (HOSPITAL_COMMUNITY): Payer: Self-pay | Admitting: *Deleted

## 2014-02-06 ENCOUNTER — Telehealth: Payer: Self-pay | Admitting: Oncology

## 2014-02-06 ENCOUNTER — Emergency Department (HOSPITAL_COMMUNITY)
Admission: EM | Admit: 2014-02-06 | Discharge: 2014-02-27 | Disposition: E | Payer: Medicare Other | Attending: Emergency Medicine | Admitting: Emergency Medicine

## 2014-02-06 DIAGNOSIS — Z79899 Other long term (current) drug therapy: Secondary | ICD-10-CM | POA: Insufficient documentation

## 2014-02-06 DIAGNOSIS — Z7982 Long term (current) use of aspirin: Secondary | ICD-10-CM | POA: Insufficient documentation

## 2014-02-06 DIAGNOSIS — G8929 Other chronic pain: Secondary | ICD-10-CM | POA: Diagnosis not present

## 2014-02-06 DIAGNOSIS — Z794 Long term (current) use of insulin: Secondary | ICD-10-CM | POA: Diagnosis not present

## 2014-02-06 DIAGNOSIS — Z85110 Personal history of malignant carcinoid tumor of bronchus and lung: Secondary | ICD-10-CM | POA: Insufficient documentation

## 2014-02-06 DIAGNOSIS — Z8719 Personal history of other diseases of the digestive system: Secondary | ICD-10-CM | POA: Insufficient documentation

## 2014-02-06 DIAGNOSIS — Z8673 Personal history of transient ischemic attack (TIA), and cerebral infarction without residual deficits: Secondary | ICD-10-CM | POA: Diagnosis not present

## 2014-02-06 DIAGNOSIS — E872 Acidosis, unspecified: Secondary | ICD-10-CM

## 2014-02-06 DIAGNOSIS — I472 Ventricular tachycardia, unspecified: Secondary | ICD-10-CM

## 2014-02-06 DIAGNOSIS — J449 Chronic obstructive pulmonary disease, unspecified: Secondary | ICD-10-CM | POA: Insufficient documentation

## 2014-02-06 DIAGNOSIS — E785 Hyperlipidemia, unspecified: Secondary | ICD-10-CM | POA: Insufficient documentation

## 2014-02-06 DIAGNOSIS — M1990 Unspecified osteoarthritis, unspecified site: Secondary | ICD-10-CM | POA: Insufficient documentation

## 2014-02-06 DIAGNOSIS — Z87891 Personal history of nicotine dependence: Secondary | ICD-10-CM | POA: Insufficient documentation

## 2014-02-06 DIAGNOSIS — I469 Cardiac arrest, cause unspecified: Secondary | ICD-10-CM | POA: Diagnosis present

## 2014-02-06 DIAGNOSIS — E119 Type 2 diabetes mellitus without complications: Secondary | ICD-10-CM | POA: Diagnosis not present

## 2014-02-06 DIAGNOSIS — Z792 Long term (current) use of antibiotics: Secondary | ICD-10-CM | POA: Insufficient documentation

## 2014-02-06 DIAGNOSIS — Z87448 Personal history of other diseases of urinary system: Secondary | ICD-10-CM | POA: Insufficient documentation

## 2014-02-06 DIAGNOSIS — I10 Essential (primary) hypertension: Secondary | ICD-10-CM | POA: Diagnosis not present

## 2014-02-06 LAB — I-STAT CHEM 8, ED
BUN: 23 mg/dL (ref 6–23)
Calcium, Ion: 0.94 mmol/L — ABNORMAL LOW (ref 1.13–1.30)
Chloride: 104 mEq/L (ref 96–112)
Creatinine, Ser: 1.4 mg/dL — ABNORMAL HIGH (ref 0.50–1.35)
Glucose, Bld: 186 mg/dL — ABNORMAL HIGH (ref 70–99)
HCT: 41 % (ref 39.0–52.0)
Hemoglobin: 13.9 g/dL (ref 13.0–17.0)
Potassium: 3.8 mEq/L (ref 3.7–5.3)
SODIUM: 142 meq/L (ref 137–147)
TCO2: 23 mmol/L (ref 0–100)

## 2014-02-06 LAB — I-STAT CG4 LACTIC ACID, ED: Lactic Acid, Venous: 9.11 mmol/L — ABNORMAL HIGH (ref 0.5–2.2)

## 2014-02-06 LAB — I-STAT TROPONIN, ED: Troponin i, poc: 0.05 ng/mL (ref 0.00–0.08)

## 2014-02-06 MED ORDER — SODIUM BICARBONATE 8.4 % IV SOLN
INTRAVENOUS | Status: DC | PRN
  Administered 2014-02-06: 50 meq via INTRAVENOUS

## 2014-02-06 MED ORDER — CALCIUM CHLORIDE 10 % IV SOLN
INTRAVENOUS | Status: DC | PRN
  Administered 2014-02-06: 1 g via INTRAVENOUS

## 2014-02-06 MED ORDER — EPINEPHRINE HCL 0.1 MG/ML IJ SOSY
PREFILLED_SYRINGE | INTRAMUSCULAR | Status: DC | PRN
  Administered 2014-02-06 (×3): 1 mg via INTRAVENOUS

## 2014-02-14 MED FILL — Medication: Qty: 1 | Status: AC

## 2014-02-17 ENCOUNTER — Ambulatory Visit: Payer: TRICARE For Life (TFL)

## 2014-02-27 NOTE — Code Documentation (Signed)
Patient time of death occurred at [phone]

## 2014-02-27 NOTE — ED Notes (Signed)
Shock (120) rhythm Wide complex then converted to [employer]

## 2014-02-27 NOTE — ED Notes (Signed)
This nurse called [facility] and they did leave a message.  This nurse called the house number 3 times without success of getting through to wife.

## 2014-02-27 NOTE — ED Notes (Signed)
Amio 150mg  given by Renda Rolls, RN ordered by Dr. Roxanne Mins

## 2014-02-27 NOTE — ED Notes (Signed)
Shock (150) Wide complex then into Vtach

## 2014-02-27 NOTE — Telephone Encounter (Signed)
Called [facility] to advise [name] of his CT scan results.  Per his nurse, Libby Maw, he passed away this morning.  She said she was told it may have been a heart attack.  She said his wife is also a resident and said she is taking it very hard.  Advised that Dr. Sondra Come would be notified.

## 2014-02-27 NOTE — ED Notes (Signed)
Amiodorone 150mg  given by Renda Rolls, RN irdered by Dr, Roxanne Mins

## 2014-02-27 NOTE — ED Provider Notes (Signed)
CSN: [id number]     Arrival date & time February 19, 2014  0545 History   First MD Initiated Contact with Patient 19-Feb-2014 0622     Chief Complaint  Patient presents with  . Cardiac Arrest     (Consider location/radiation/quality/duration/timing/severity/associated sxs/prior Treatment) The history is provided by the EMS personnel. The history is limited by the condition of the patient (Cardiac arrest).   81 year old male is brought in with CPR in progress. He was reported to have been found unresponsive at nursing home with CPR started promptly. Downtime estimated at less than 5 minutes. EMS arrived and started an intraosseous line and I inserted a King airway. He was noted to be in first ventricular fibrillation which was successfully defibrillated and then went into ventricular tachycardia which was successfully cardioverted. He received multiple doses of epinephrine and also amiodarone en route. They did relate that he had brief resumption of spontaneous circulation but lost pulses and had to resume CPR. They estimate about 20 minutes from the time they arrived until he arrived in the emergency department.  Past Medical History  Diagnosis Date  . Chronic shoulder pain   . Hyperlipidemia   . Atrial fibrillation   . Atrial flutter   . Tachycardia-bradycardia   . Bradycardia   . GI bleeding   . Frequent falls   . BPH (benign prostatic hypertrophy)   . Asthma   . COPD (chronic obstructive pulmonary disease)   . Chronic constipation   . Spinal stenosis   . Peripheral neuropathy   . Hard of hearing   . Post-traumatic stress   . Osteoarthritis   . Arm fracture, left   . War injury due to shrapnel     Left shoulder  . Fall against object 06/02/2012  . Multiple closed fractures of ribs of right side 06/02/2012  . Hyperlipidemia   . A-fib   . BPH (benign prostatic hyperplasia)   . Hx of radiation therapy 6/24, 6/26, 08/28/2012    SBRT LUL lung nodule, 54 gray in 3 fx  . Chronic systolic CHF  (congestive heart failure) 09/2012    EF 20-25%  . Diabetes mellitus   . Diabetes mellitus, type 2   . Hypertension   . Hypertension   . Stroke 1997    l sided weakness  . Lung cancer     left  . Critical lower limb ischemia    Past Surgical History  Procedure Laterality Date  . Insert / replace / remove pacemaker  08-16-2010    left upper chest  . Knee surgery    . Ivc filter    . Cardioversion      2005  . Arm surgery     Family History  Problem Relation Age of Onset  . Diabetes Mother     died on her 11's  . Peripheral vascular disease Mother   . Heart attack Father     died on his 7's  . Diabetes Brother    History  Substance Use Topics  . Smoking status: Former Smoker    Quit date: 02/27/1994  . Smokeless tobacco: Never Used  . Alcohol Use: No    Review of Systems  Unable to perform ROS: Intubated      Allergies  Beta adrenergic blockers; Digoxin and related; and Zocor  Home Medications   Prior to Admission medications   Medication Sig Start Date End Date Taking? Authorizing Provider  acetaminophen (TYLENOL) 325 MG tablet Take 650 mg by mouth every 6 (six) hours  as needed.    Historical Provider, MD  amiodarone (PACERONE) 200 MG tablet Take 200 mg by mouth daily.    Historical Provider, MD  amLODipine (NORVASC) 5 MG tablet Take 5 mg by mouth daily.    Historical Provider, MD  aspirin 81 MG tablet Take 81 mg by mouth daily.    Historical Provider, MD  atorvastatin (LIPITOR) 10 MG tablet Take 10 mg by mouth daily.    Historical Provider, MD  benzonatate (TESSALON) 100 MG capsule Take 200 mg by mouth 3 (three) times daily. 06/05/12   Monika Salk, MD  calcium-vitamin D (OSCAL WITH D) 500-200 MG-UNIT per tablet Take 1 tablet by mouth daily.    Historical Provider, MD  Cholecalciferol (VITAMIN D3) 1000 UNITS CAPS Take 1,000 Units by mouth every morning.     Historical Provider, MD  doxazosin (CARDURA) 2 MG tablet Take 2 mg by mouth at bedtime.      Historical  Provider, MD  doxycycline (DORYX) 100 MG EC tablet Take 100 mg by mouth daily.    Historical Provider, MD  ezetimibe (ZETIA) 10 MG tablet Take 10 mg by mouth every morning.     Historical Provider, MD  finasteride (PROSCAR) 5 MG tablet Take 5 mg by mouth daily.    Historical Provider, MD  furosemide (LASIX) 40 MG tablet Take 40 mg by mouth 2 (two) times daily.      Historical Provider, MD  gabapentin (NEURONTIN) 100 MG capsule Take 100 mg by mouth 2 (two) times daily.    Historical Provider, MD  hydrALAZINE (APRESOLINE) 25 MG tablet Take 25 mg by mouth 3 (three) times daily.      Historical Provider, MD  insulin lispro (HUMALOG) 100 UNIT/ML injection Inject 1-9 Units into the skin 2 (two) times daily with a meal. 120-150 = 1 unit; 151-200 = 2 units; 201-250 = 3 units; 250-300 = 5 units; 301-350 = 7 units; 351-400 = 9 units    Historical Provider, MD  niacin (NIASPAN) 500 MG CR tablet Take 500 mg by mouth at bedtime.     Historical Provider, MD  potassium chloride (K-DUR) 10 MEQ tablet Take 20 mEq by mouth daily.    Historical Provider, MD  propranolol (INDERAL) 10 MG tablet Take 10 mg by mouth 2 (two) times daily.    Historical Provider, MD  ramipril (ALTACE) 10 MG capsule Take 10 mg by mouth 2 (two) times daily.      Historical Provider, MD  silver sulfADIAZINE (SILVADENE) 1 % cream Apply 1 application topically daily. 12/30/13   Harriet Masson, DPM  traMADol (ULTRAM) 50 MG tablet Take 50 mg by mouth every 6 (six) hours as needed.    Historical Provider, MD  traZODone (DESYREL) 50 MG tablet Take 50 mg by mouth at bedtime.      Historical Provider, MD   BP 126/96 mmHg  Resp 11  Ht 5\' 10"  (1.778 m)  Wt 203 lb (92.08 kg)  BMI 29.13 kg/m2  SpO2 85% Physical Exam  Nursing note and vitals reviewed.  81 year old male, intubated with a King airway and CPR in progress. Head is normocephalic and atraumatic. Pupils are 3 mm and nonreactive. Lungs have symmetric air movement. Chest is  nontender. Heart has regular rate and rhythm without murmur. Abdomen is soft, flat, with no air movement heard over the epigastrium when bag-valve-tube respiration is performed.. Extremities: Intraosseous line is present in the left proximal tibia. Skin is warm and dry without rash. Neurologic: GCS=3.  ED Course  Procedures (including critical care time) CENTRAL LINE Performed by: VZCHY,IFOYD Consent: The procedure was performed in an emergent situation. Required items: required blood products, implants, devices, and special equipment available Patient identity confirmed: arm band and provided demographic data Time out: Immediately prior to procedure a "time out" was called to verify the correct patient, procedure, equipment, support staff and site/side marked as required. Indications: vascular access Anesthesia: none Patient sedated: no Preparation: skin prepped with 2% chlorhexidine Skin prep agent dried: skin prep agent completely dried prior to procedure Hand hygiene: hand hygiene performed prior to central venous catheter insertion Location details: right femoral vein Catheter type: triple lumen Catheter size: 8 Fr Pre-procedure: landmarks identified Ultrasound guidance: no Successful placement: yes Post-procedure: line sutured and dressing applied Assessment: blood return through all parts, free fluid flow Patient tolerance: Patient tolerated the procedure well with no immediate complications.   Labs Review Results for orders placed or performed during the hospital encounter of 02-23-14  I-stat chem 8, ed  Result Value Ref Range   Sodium 142 137 - 147 mEq/L   Potassium 3.8 3.7 - 5.3 mEq/L   Chloride 104 96 - 112 mEq/L   BUN 23 6 - 23 mg/dL   Creatinine, Ser 1.40 (H) 0.50 - 1.35 mg/dL   Glucose, Bld 186 (H) 70 - 99 mg/dL   Calcium, Ion 0.94 (L) 1.13 - 1.30 mmol/L   TCO2 23 0 - 100 mmol/L   Hemoglobin 13.9 13.0 - 17.0 g/dL   HCT 41.0 39.0 - 52.0 %  I-Stat CG4 Lactic  Acid, ED  Result Value Ref Range   Lactic Acid, Venous 9.11 (H) 0.5 - 2.2 mmol/L  I-stat troponin, ED  Result Value Ref Range   Troponin i, poc 0.05 0.00 - 0.08 ng/mL   Comment 3            EKG Interpretation   Date/Time:  02-23-14 06:03:36 EST Ventricular Rate:  46 PR Interval:    QRS Duration: 190 QT Interval:  547 QTC Calculation: 478 R Axis:   -88 Text Interpretation:  Junctional rhythm Right bundle branch block LVH with  IVCD and secondary repol abnrm Borderline prolonged QT interval When  compared with ECG of 05/23/2013, Junctional bradycardia has replaced ATRIAL  PACED RHYTHM Right bundle branch block is now Present Non-specific  intra-ventricular conduction delay is now Present Confirmed by [facility]  MD,  Tevion Laforge ([id number]) on 02-23-2014 6:50:51 AM      Cardiopulmonary Resuscitation (CPR) Procedure Note Directed/Performed by: Delora Fuel I personally directed ancillary staff and/or performed CPR in an effort to regain return of spontaneous circulation and to maintain cardiac, neuro and systemic perfusion.   MDM   Final diagnoses:  Cardiac arrest  Ventricular tachycardia  Pulseless electrical activity  Lactic acidosis    Patient presented with CPR in progress. Wide complex tachycardia was seen and felt to represent ventricular tachycardia.  He was cardioverted with initial narrow complex QRS with pulse which then became wide complex and tachycardic once more. This process was repeated several times. IV access was obtained through a right femoral line insertion (see procedure note above). There was good airflow through [facility] airway so decision was made not to replace that. Rhythm would respond to DC cardioversion but was not stable. He was given additional doses of amiodarone without any change in clinical response. Because of concern that wide complex could represent hyperkalemia, he was given sodium bicarbonate and calcium gluconate with no change in clinical  response.  Laboratory work done also blood drawn at time of central vein insertion showed no hyperkalemia and no elevation of troponin but lactic acid was severely elevated. After third harsher cough shock, pulse was very thready. He also received multiple doses of epinephrine. Rhythm deteriorated into pulseless electrical activity. Patient was felt to have demonstrated cardiac unresponsiveness and was pronounced dead at 6:20 AM. Case was discussed with Dr. Dagmar Hait, on call for Dr. self, he states that Dr. Kevan Ny will sign the death certificate. His old records were reviewed and he has a very complex history including lung cancer, peripheral arterial disease, pacemaker insertion, atrial fibrillation, pulmonary embolism with IVC filter.    Delora Fuel, MD 95/28/41 [id number]

## 2014-02-27 NOTE — ED Notes (Signed)
Patient arrived via EMS from [facility].  EMS reported when they arrived the patient was on the floor with CPR in progress.  EMS reported that the patient was warm to touch.  EMS continued CPR with [facility] airway in place.  Shocked times 2 first shock was Afib and the second shock was Tanna Furry which is the rhythm he arrived in the ED with.  They also adm 4 Epi and Amiodorone 300mg .

## 2014-02-27 NOTE — Progress Notes (Signed)
Chaplain responded to page from ED. Chaplain introduced herself to family and offered prayer. Pt nephew and niece-in-law at bedside. Chaplain services not needed at this time. Page chaplain as needed.   13-Feb-2014 0900  Clinical Encounter Type  Visited With Family;Health care provider  Visit Type Death;Spiritual support  Referral From Nurse  Spiritual Encounters  Spiritual Needs Grief support;Emotional;Prayer  Stress Factors  Family Stress Factors Loss;Family relationships  Vong Garringer, Barbette Hair, Chaplain 13-Feb-2014 9:08 AM

## 2014-02-27 NOTE — ED Notes (Signed)
Shock (200) Vtach with wide complex no pulses felt.

## 2014-02-27 DEATH — deceased

## 2014-03-11 ENCOUNTER — Ambulatory Visit: Payer: Medicare Other | Admitting: Cardiovascular Disease

## 2014-07-09 ENCOUNTER — Ambulatory Visit: Payer: Medicare Other | Admitting: Radiation Oncology

## 2015-03-09 IMAGING — CR DG RIBS W/ CHEST 3+V*L*
4 series · 4 of 4 positions shown · non-contrast
Comparison: 02/13/2011

CLINICAL DATA: Status post fall.  Pain.

LEFT RIBS AND CHEST - 3+ VIEW

[t chest supine]
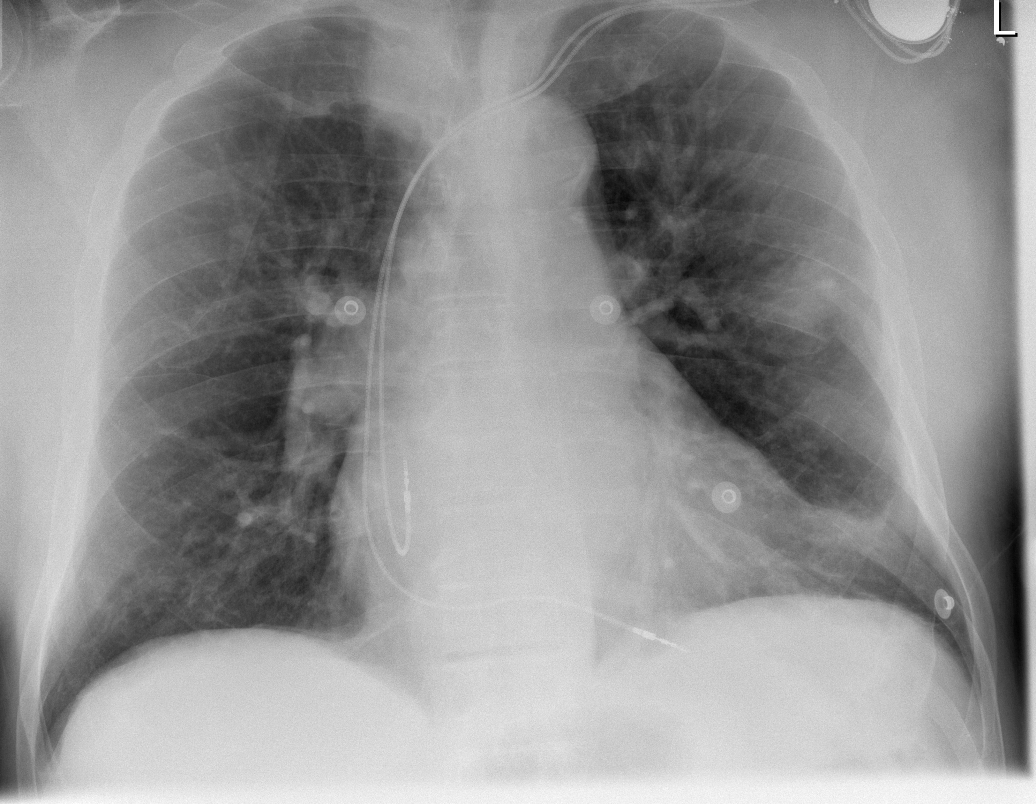

[t ribs ap/pa upper left]
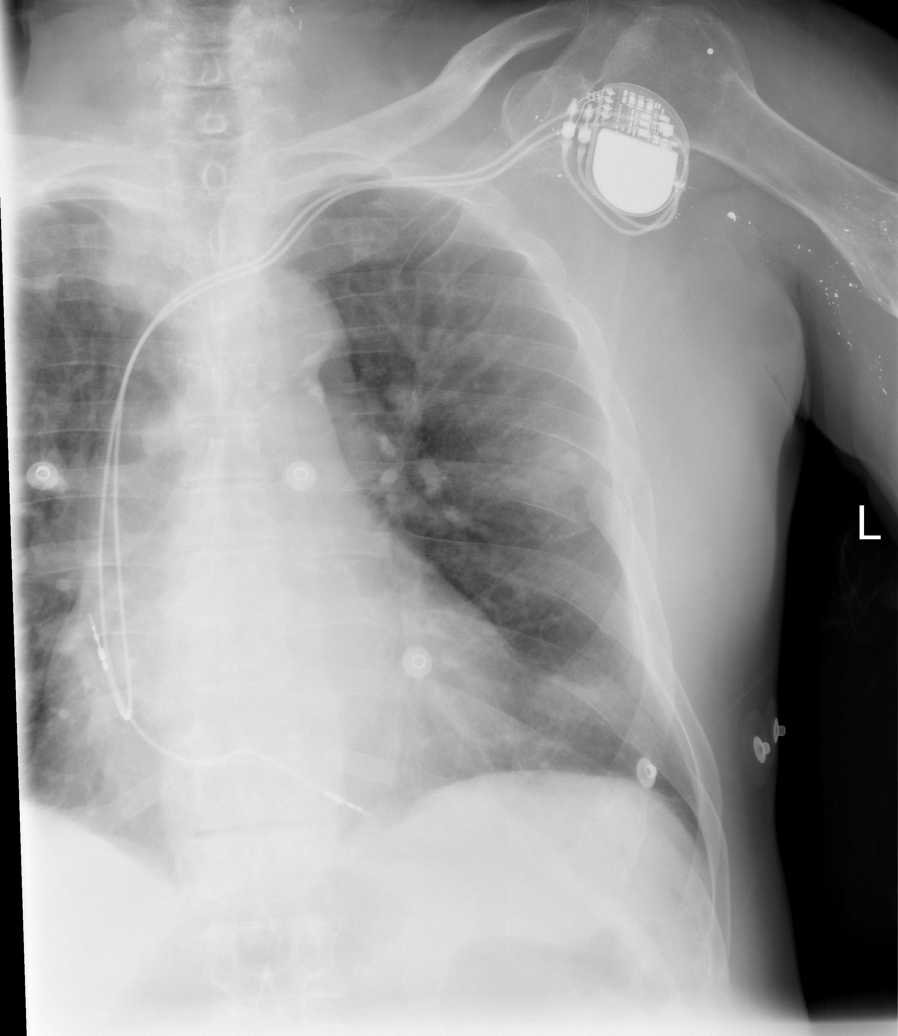

[t ribs ap/pa  lower left]
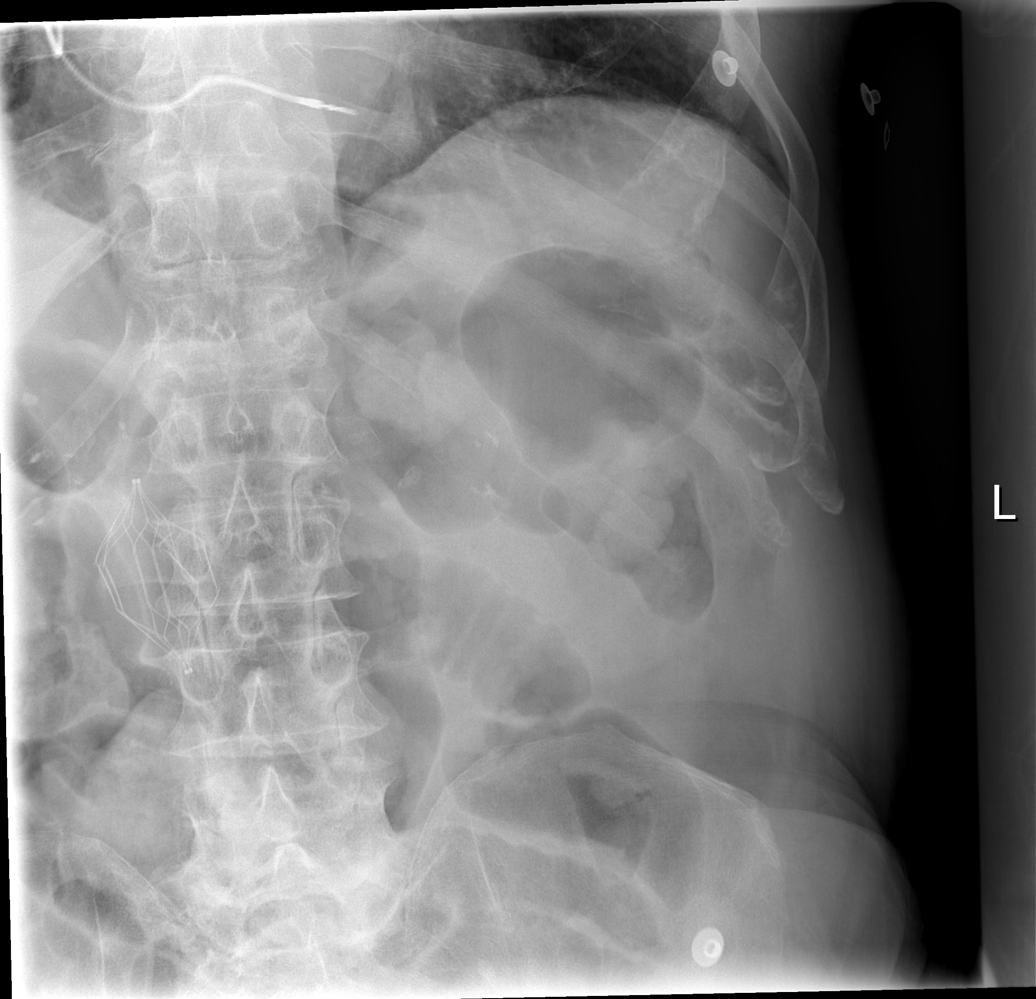

[t ribs obl. left]
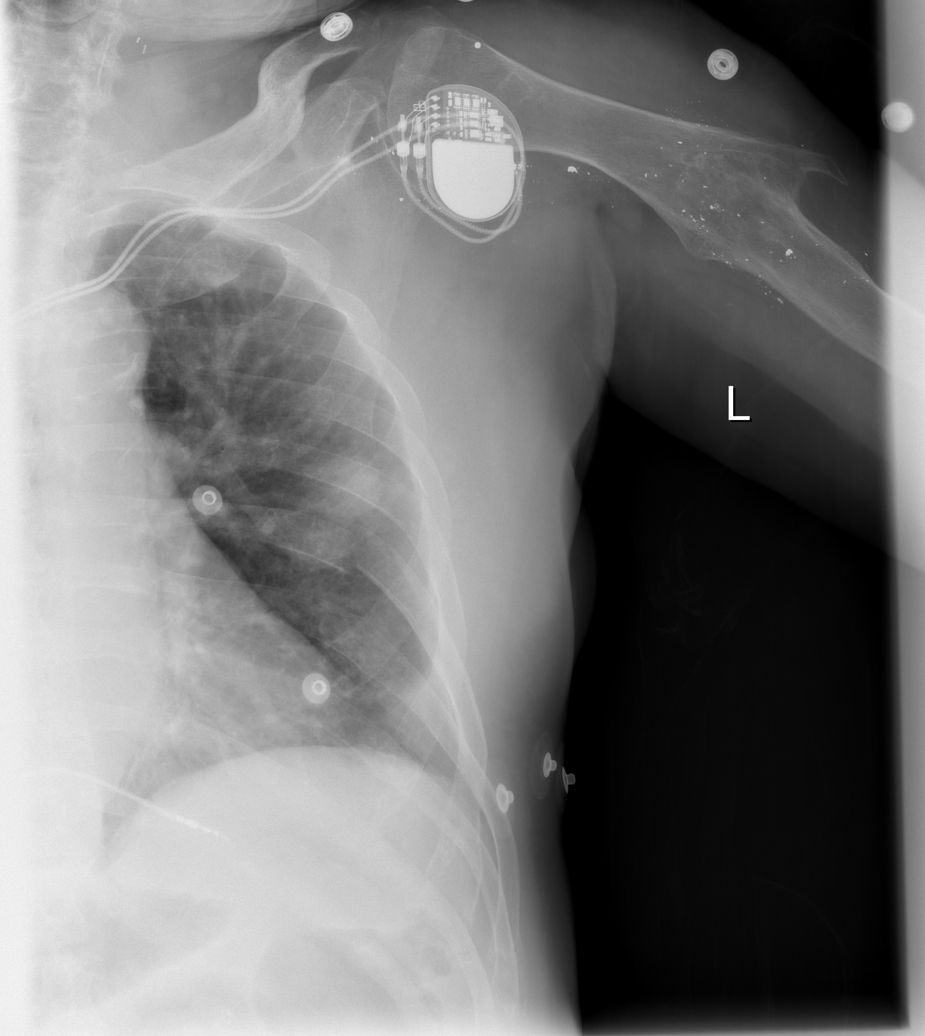

[4 of 4 positions shown; findings below may reference images not displayed]

FINDINGS: There is a right chest wall pacer device with lead in the
right atrial appendage and right ventricle.  Heart size is mildly
enlarged.  No pleural effusion or edema.  There is a focal opacity
within the left midlung. There are adjacent seventh and eighth left
posterior rib fractures.  The right lung appears clear. The patient
is known to have a IVC filter.  Chronic healed fracture of the left
humerus noted.
IMPRESSION: 1.  Acute left seventh and eighth rib fractures with contusion of
the adjacent left lung.

## 2015-03-09 IMAGING — CT CT ANGIO CHEST
2 of 7 series · 19 of 36 positions shown · IV contrast (APPLIED)
Comparison: 02/13/2011

CLINICAL DATA: Chest pain

CT ANGIOGRAPHY CHEST
TECHNIQUE: Multidetector CT imaging of the chest using the
standard protocol during bolus administration of intravenous
contrast. Multiplanar reconstructed images including MIPs were
obtained and reviewed to evaluate the vascular anatomy.
Contrast: 80mL OMNIPAQUE IOHEXOL 350 MG/ML SOLN

[Series 6: pulm embolism 1.0 b25f st · axial · 0.74mm/px · z∈[-306,-42]mm · 18 of 294 slices shown]
[im 15/294  lung]
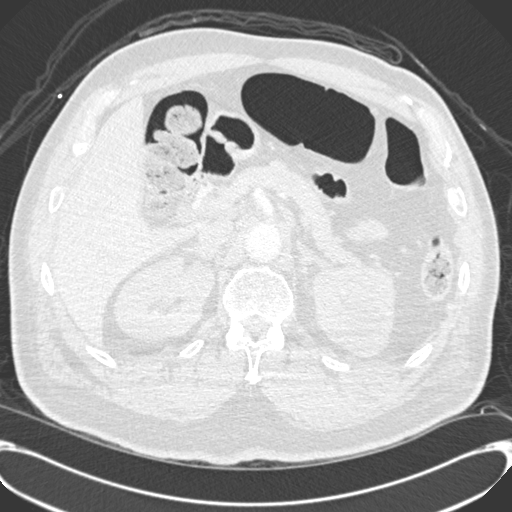
[im 30/294  mediastinal]
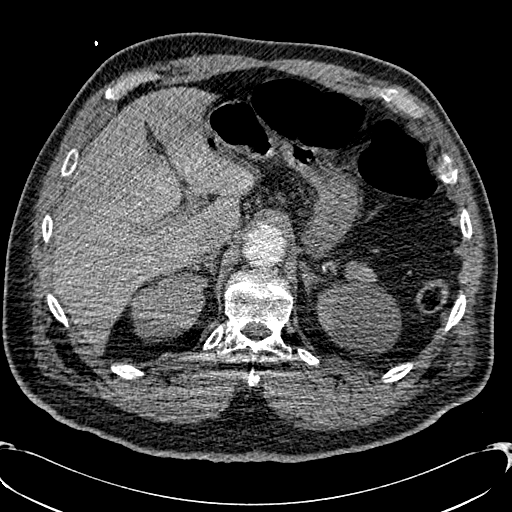
[im 44/294  lung]
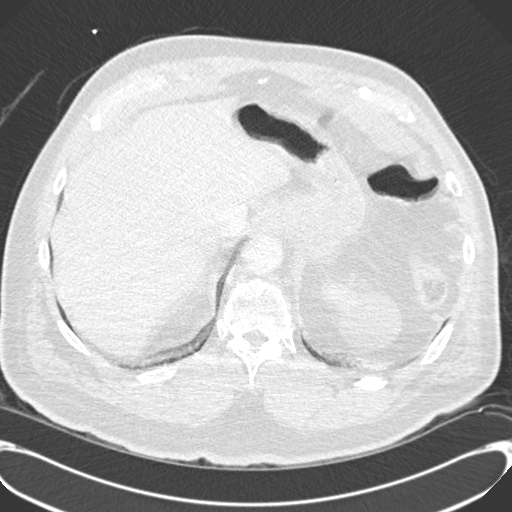
[im 59/294  mediastinal]
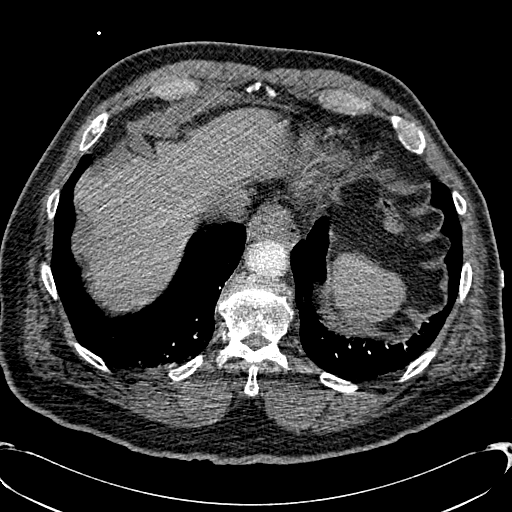
[im 74/294  lung]
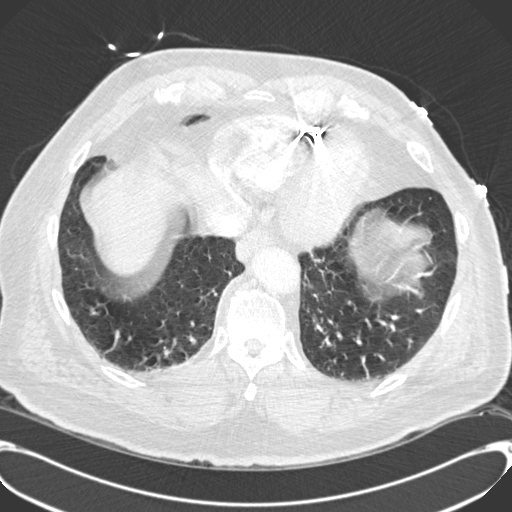
[im 88/294  mediastinal]
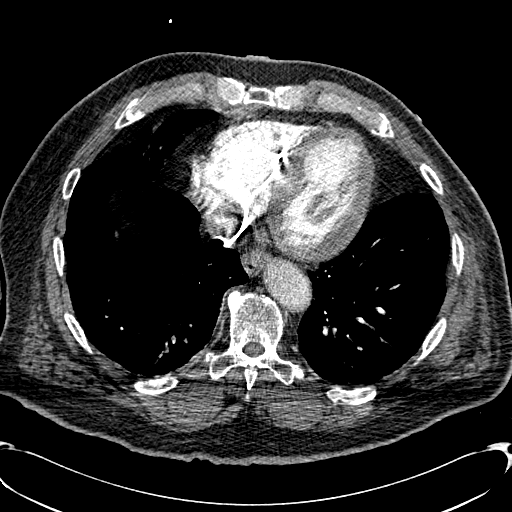
[im 103/294  lung]
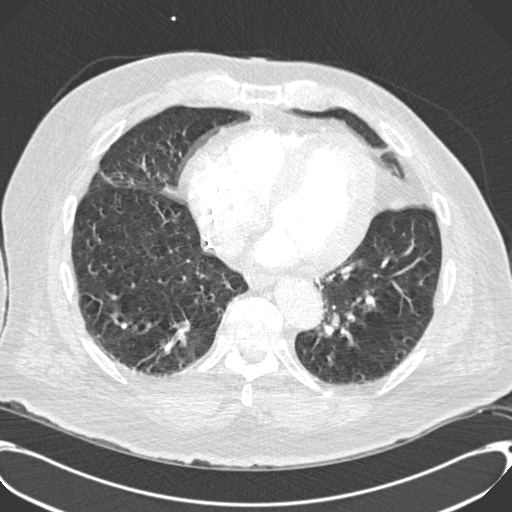
[im 118/294  mediastinal]
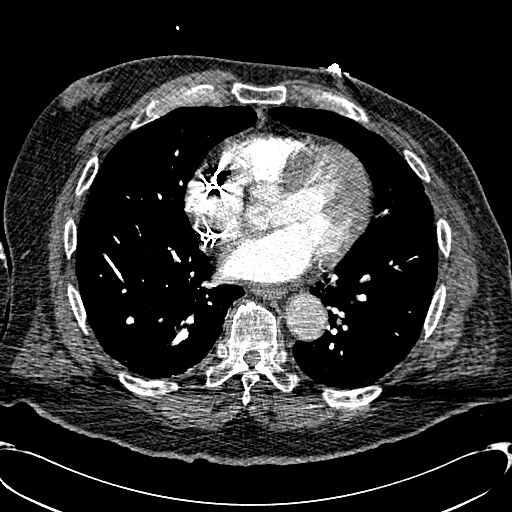
[im 132/294  lung]
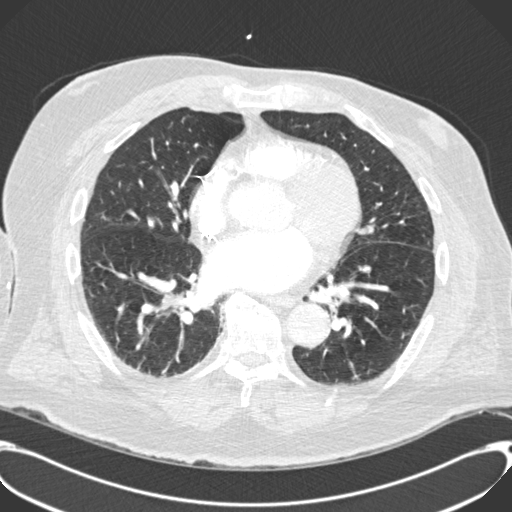
[im 162/294  mediastinal]
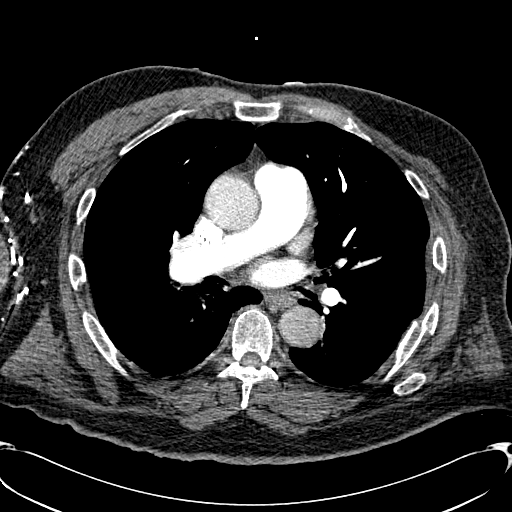
[im 176/294  lung]
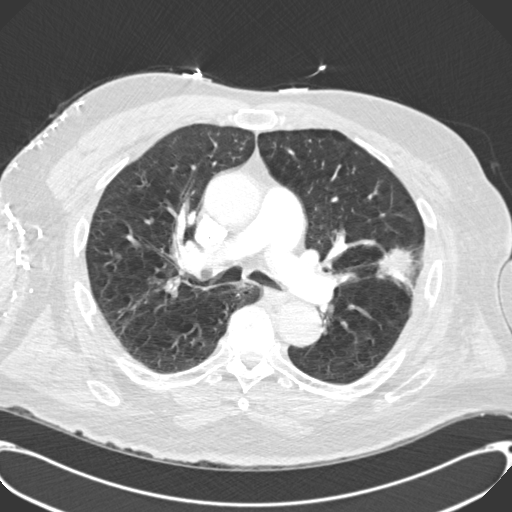
[im 191/294  mediastinal]
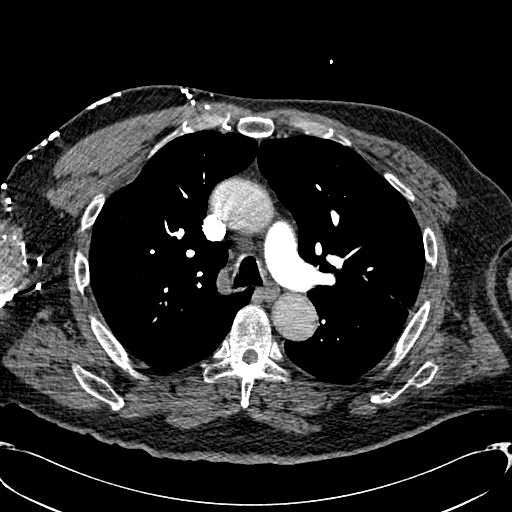
[im 206/294  lung]
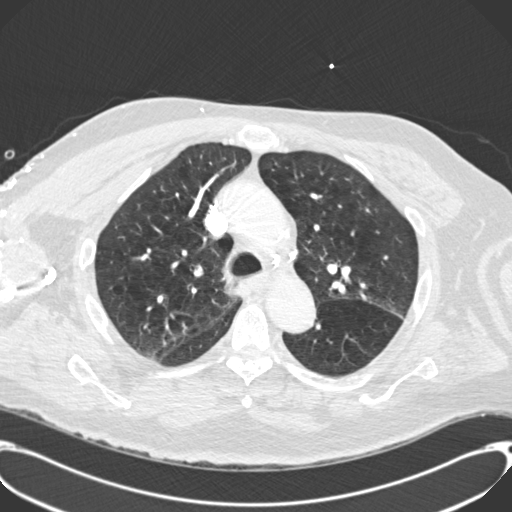
[im 220/294  mediastinal]
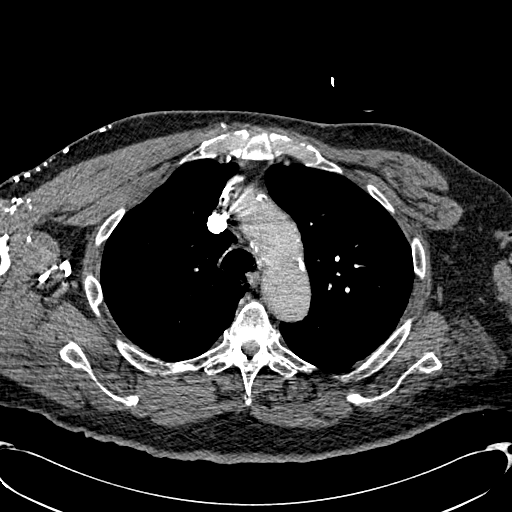
[im 235/294  lung]
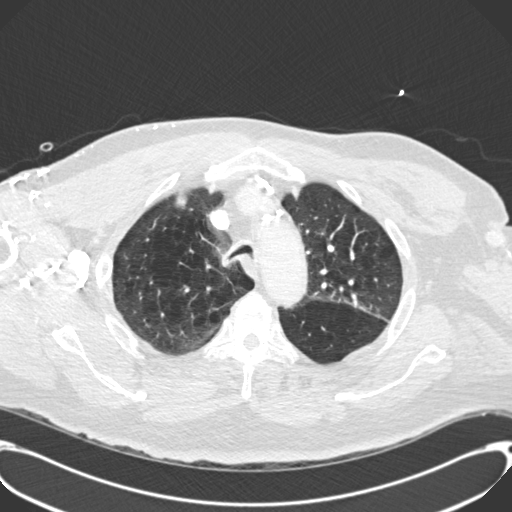
[im 250/294  mediastinal]
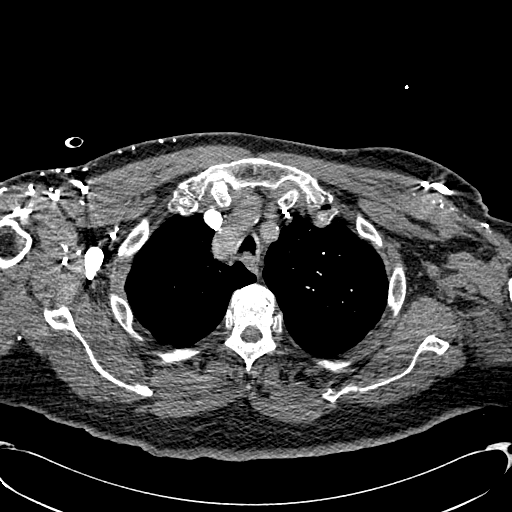
[im 264/294  lung]
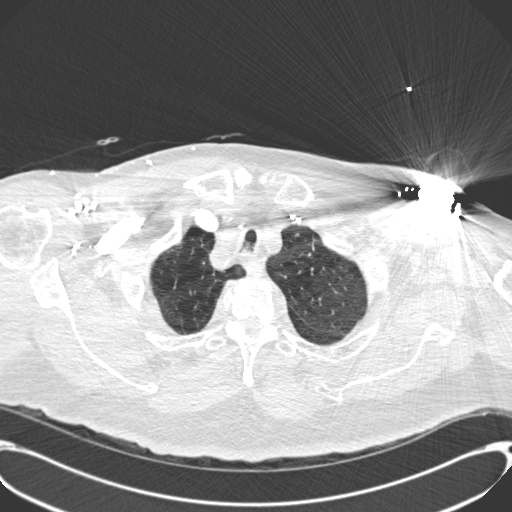
[im 279/294  mediastinal]
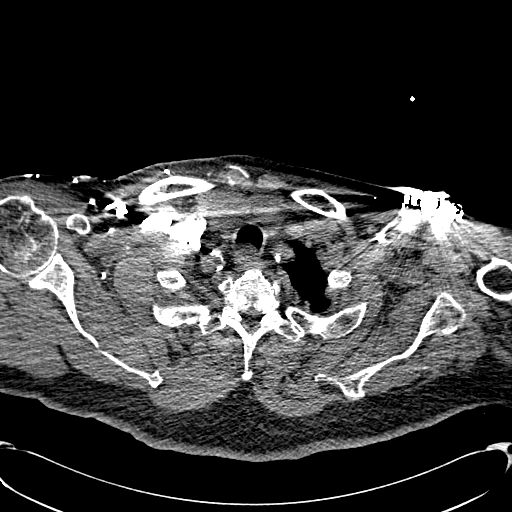

[Series 8: coronals · coronal · 0.65mm/px · 1 of 119 slices shown]
[im 60/119  mediastinal]
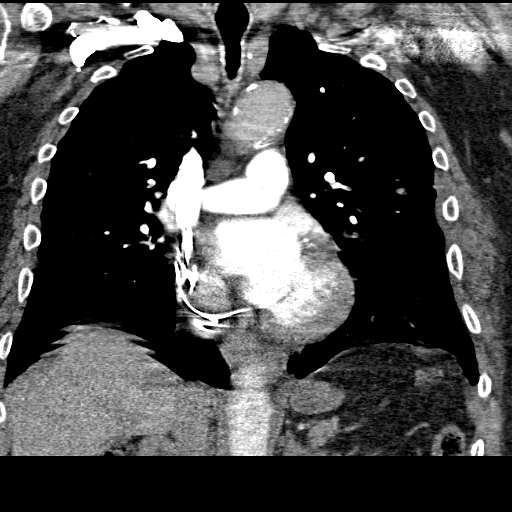

[19 of 36 positions shown; findings below may reference images not displayed]

FINDINGS: Lungs/pleura: There is no pleural effusion identified.
Within the left upper lobe there is a suspicious nodule measuring
2.5 x 2.2 cm, image 41/series 5.  Moderate changes of emphysema.
No pulmonary contusion identified.

Heart/Mediastinum: Mild cardiac enlargement.  Coronary artery
calcifications involve the RCA, LAD and left circumflex coronary
arteries.  No pericardial effusion.  There is no enlarged
mediastinal or hilar lymph nodes.  The pulmonary artery appears
patent.  There is no lobe bar or segmental pulmonary arterial
embolus identified.

Upper abdomen: The adrenal glands both appear normal. Suspicious
appearing cyst is noted arising from the upper pole of the left
kidney measuring 4.9 cm.

Bones/Musculoskeletal:  There is mild multilevel spondylosis within
the thoracic spine.  Acute left sixth and seventh rib fractures are
noted.  No aggressive lytic or sclerotic bone lesions identified.
IMPRESSION: 1.  No evidence for acute pulmonary embolus.
2.  There is a nodule in the left upper lobe which has increased in
size from previous exam.  This is concerning for primary
bronchogenic carcinoma.
3.  Suspicious nodule within the upper pole the left kidney is only
partially visualized.  Advise further evaluation with non emergent
cross-sectional imaging through the kidneys. In this patient who
has a pacemaker the study of choice would be a pre and post
contrast CT of the kidneys.
